# Patient Record
Sex: Male | Born: 1985 | Race: Black or African American | Hispanic: No | Marital: Single | Smoking: Current every day smoker
Health system: Southern US, Community
[De-identification: ages and names within clinical notes are randomized; demographics above are authoritative.]

## PROBLEM LIST (undated history)

## (undated) DIAGNOSIS — K219 Gastro-esophageal reflux disease without esophagitis: Secondary | ICD-10-CM

## (undated) DIAGNOSIS — J45909 Unspecified asthma, uncomplicated: Secondary | ICD-10-CM

## (undated) DIAGNOSIS — S42009A Fracture of unspecified part of unspecified clavicle, initial encounter for closed fracture: Secondary | ICD-10-CM

## (undated) HISTORY — PX: HERNIA REPAIR: SHX51

## (undated) HISTORY — PX: NO PAST SURGERIES: SHX2092

---

## 2008-04-26 ENCOUNTER — Emergency Department: Payer: Self-pay | Admitting: Emergency Medicine

## 2010-07-04 ENCOUNTER — Emergency Department: Payer: Self-pay | Admitting: Internal Medicine

## 2010-08-03 ENCOUNTER — Emergency Department: Payer: Self-pay | Admitting: Internal Medicine

## 2010-12-09 ENCOUNTER — Emergency Department: Payer: Self-pay | Admitting: Emergency Medicine

## 2011-04-28 ENCOUNTER — Emergency Department: Payer: Self-pay | Admitting: Emergency Medicine

## 2011-10-16 ENCOUNTER — Emergency Department: Payer: Self-pay | Admitting: Emergency Medicine

## 2011-10-16 LAB — BASIC METABOLIC PANEL
Anion Gap: 4 — ABNORMAL LOW (ref 7–16)
Calcium, Total: 9.1 mg/dL (ref 8.5–10.1)
Creatinine: 0.82 mg/dL (ref 0.60–1.30)
EGFR (Non-African Amer.): >60
Osmolality: 274 (ref 275–301)
Sodium: 138 mmol/L (ref 136–145)

## 2011-10-16 LAB — CBC
HGB: 14 g/dL (ref 13.0–18.0)
MCH: 32.5 pg (ref 26.0–34.0)
MCV: 97 fL (ref 80–100)
Platelet: 193 10*3/uL (ref 150–440)
RDW: 13 % (ref 11.5–14.5)

## 2011-10-16 LAB — TROPONIN I: Troponin-I: <0.02 ng/mL

## 2012-12-22 ENCOUNTER — Emergency Department: Payer: Self-pay | Admitting: Emergency Medicine

## 2012-12-24 LAB — WOUND AEROBIC CULTURE

## 2013-02-03 ENCOUNTER — Emergency Department: Payer: Self-pay | Admitting: Emergency Medicine

## 2013-02-07 ENCOUNTER — Emergency Department: Payer: Self-pay | Admitting: Emergency Medicine

## 2013-07-30 ENCOUNTER — Emergency Department: Payer: Self-pay | Admitting: Emergency Medicine

## 2014-06-09 DIAGNOSIS — Z8614 Personal history of Methicillin resistant Staphylococcus aureus infection: Secondary | ICD-10-CM

## 2014-06-09 HISTORY — DX: Personal history of Methicillin resistant Staphylococcus aureus infection: Z86.14

## 2015-08-21 ENCOUNTER — Emergency Department
Admission: EM | Admit: 2015-08-21 | Discharge: 2015-08-21 | Disposition: A | Discharge status: Home / Self Care | Payer: Self-pay | Attending: Student | Admitting: Student

## 2015-08-21 ENCOUNTER — Encounter: Payer: Self-pay | Admitting: Emergency Medicine

## 2015-08-21 DIAGNOSIS — F17200 Nicotine dependence, unspecified, uncomplicated: Secondary | ICD-10-CM | POA: Insufficient documentation

## 2015-08-21 DIAGNOSIS — H109 Unspecified conjunctivitis: Secondary | ICD-10-CM | POA: Insufficient documentation

## 2015-08-21 MED ORDER — GENTAMICIN SULFATE 0.3 % OP SOLN: 1.0000 [drp] | OPHTHALMIC | Status: DC

## 2015-08-21 NOTE — ED Notes (Signed)
Developed pain and irritation to both eyes for couple of days

## 2015-08-21 NOTE — ED Notes (Signed)
Pt in via triage w/ complaints of pain/irritation to both eyes since Sunday.  Pt reports, "I woke up Sunday with crust around both eyes."  Pt reports, "I think I may have pink eye, I have had chemical pink eye in the past."  Pt A/Ox4, no immediate distress at this time.

## 2015-08-21 NOTE — Discharge Instructions (Signed)
Bacterial Conjunctivitis °Bacterial conjunctivitis, commonly called pink eye, is an inflammation of the clear membrane that covers the white part of the eye (conjunctiva). The inflammation can also happen on the underside of the eyelids. The blood vessels in the conjunctiva become inflamed, causing the eye to become red or pink. Bacterial conjunctivitis may spread easily from one eye to another and from person to person (contagious).  °CAUSES  °Bacterial conjunctivitis is caused by bacteria. The bacteria may come from your own skin, your upper respiratory tract, or from someone else with bacterial conjunctivitis. °SYMPTOMS  °The normally white color of the eye or the underside of the eyelid is usually pink or red. The pink eye is usually associated with irritation, tearing, and some sensitivity to light. Bacterial conjunctivitis is often associated with a thick, yellowish discharge from the eye. The discharge may turn into a crust on the eyelids overnight, which causes your eyelids to stick together. If a discharge is present, there may also be some blurred vision in the affected eye. °DIAGNOSIS  °Bacterial conjunctivitis is diagnosed by your caregiver through an eye exam and the symptoms that you report. Your caregiver looks for changes in the surface tissues of your eyes, which may point to the specific type of conjunctivitis. A sample of any discharge may be collected on a cotton-tip swab if you have a severe case of conjunctivitis, if your cornea is affected, or if you keep getting repeat infections that do not respond to treatment. The sample will be sent to a lab to see if the inflammation is caused by a bacterial infection and to see if the infection will respond to antibiotic medicines. °TREATMENT  °1. Bacterial conjunctivitis is treated with antibiotics. Antibiotic eyedrops are most often used. However, antibiotic ointments are also available. Antibiotics pills are sometimes used. Artificial tears or eye  washes may ease discomfort. °HOME CARE INSTRUCTIONS  °1. To ease discomfort, apply a cool, clean washcloth to your eye for 10-20 minutes, 3-4 times a day. °2. Gently wipe away any drainage from your eye with a warm, wet washcloth or a cotton ball. °3. Wash your hands often with soap and water. Use paper towels to dry your hands. °4. Do not share towels or washcloths. This may spread the infection. °5. Change or wash your pillowcase every day. °6. You should not use eye makeup until the infection is gone. °7. Do not operate machinery or drive if your vision is blurred. °8. Stop using contact lenses. Ask your caregiver how to sterilize or replace your contacts before using them again. This depends on the type of contact lenses that you use. °9. When applying medicine to the infected eye, do not touch the edge of your eyelid with the eyedrop bottle or ointment tube. °SEEK IMMEDIATE MEDICAL CARE IF:  °· Your infection has not improved within 3 days after beginning treatment. °· You had yellow discharge from your eye and it returns. °· You have increased eye pain. °· Your eye redness is spreading. °· Your vision becomes blurred. °· You have a fever or persistent symptoms for more than 2-3 days. °· You have a fever and your symptoms suddenly get worse. °· You have facial pain, redness, or swelling. °MAKE SURE YOU:  °· Understand these instructions. °· Will watch your condition. °· Will get help right away if you are not doing well or get worse. °  °This information is not intended to replace advice given to you by your health care provider. Make sure you   discuss any questions you have with your health care provider. °  °Document Released: 05/26/2005 Document Revised: 06/16/2014 Document Reviewed: 10/27/2011 °Elsevier Interactive Patient Education ©2016 [employer] ° °How to Use Eye Drops and Eye Ointments °HOW TO APPLY EYE DROPS °Follow these steps when applying eye drops: °2. Wash your hands. °3. Tilt your head  back. °4. Put a finger under your eye and use it to gently pull your lower lid downward. Keep that finger in place. °5. Using your other hand, hold the dropper between your thumb and index finger. °6. Position the dropper just over the edge of the lower lid. Hold it as close to your eye as you can without touching the dropper to your eye. °7. Steady your hand. One way to do this is to lean your index finger against your brow. °8. Look up. °9. Slowly and gently squeeze one drop of medicine into your eye. °10. Close your eye. °11. Place a finger between your lower eyelid and your nose. Press gently for 2 minutes. This increases the amount of time that the medicine is exposed to the eye. It also reduces side effects that can develop if the drop gets into the bloodstream through the nose. °HOW TO APPLY EYE OINTMENTS °Follow these steps when applying eye ointments: °10. Wash your hands. °11. Put a finger under your eye and use it to gently pull your lower lid downward. Keep that finger in place. °12. Using your other hand, place the tip of the tube between your thumb and index finger with the remaining fingers braced against your cheek or nose. °13. Hold the tube just over the edge of your lower lid without touching the tube to your lid or eyeball. °14. Look up. °15. Line the inner part of your lower lid with ointment. °16. Gently pull up on your upper lid and look down. This will force the ointment to spread over the surface of the eye. °17. Release the upper lid. °18. If you can, close your eyes for 1-2 minutes. °Do not rub your eyes. If you applied the ointment correctly, your vision will be blurry for a few minutes. This is normal. °ADDITIONAL INFORMATION °· Make sure to use the eye drops or ointment as told by your health care provider. °· If you have been told to use both eye drops and an eye ointment, apply the eye drops first, then wait 3-4 minutes before you apply the ointment. °· Try not to touch the tip of the  dropper or tube to your eye. A dropper or tube that has touched the eye can become contaminated. °  °This information is not intended to replace advice given to you by your health care provider. Make sure you discuss any questions you have with your health care provider. °  °Document Released: 09/01/2000 Document Revised: 10/10/2014 Document Reviewed: 05/22/2014 °Elsevier Interactive Patient Education ©2016 [employer] ° °

## 2015-08-21 NOTE — ED Provider Notes (Signed)
[facility] Emergency Department Provider Note  ____________________________________________  Time seen: Approximately 7:27 PM  I have reviewed the triage vital signs and the nursing notes.   HISTORY  Chief Complaint Eye Drainage    HPI Hyman BowerDamien A Chuba is a 30 y.o. male patient complaining of bilateral greenish discharge from his eyes. Patient stateeyelids are matted shut upon awakening. Onset 2 days ago. He denies any vision disturbance. Patient denies any upper respiratory infection. States do not wear contact lenses. Patient rates his pain discomfort as a 4/10. No palliative measures taken for this complaint.   No past medical history on file.  There are no active problems to display for this patient.   History reviewed. No pertinent past surgical history.  Current Outpatient Rx  Name  Route  Sig  Dispense  Refill  . gentamicin (GARAMYCIN) 0.3 % ophthalmic solution   Both Eyes   Place 1 drop into both eyes every 4 (four) hours.   5 mL   0     Allergies Review of patient's allergies indicates no known allergies.  No family history on file.  Social History Social History  Substance Use Topics  . Smoking status: Current Every Day Smoker  . Smokeless tobacco: None  . Alcohol Use: Yes    Review of Systems Constitutional: No fever/chills Eyes: No visual changes. Bilateral eye discharge. Matted eyelids. ENT: No sore throat. Cardiovascular: Denies chest pain. Respiratory: Denies shortness of breath. Gastrointestinal: No abdominal pain.  No nausea, no vomiting.  No diarrhea.  No constipation. Genitourinary: Negative for dysuria. Musculoskeletal: Negative for back pain. Skin: Negative for rash. Neurological: Negative for headaches, focal weakness or numbness.    ____________________________________________   PHYSICAL EXAM:  VITAL SIGNS: ED Triage Vitals  Enc Vitals Group     BP 08/21/15 1923 140/90 mmHg     Pulse Rate 08/21/15  1923 70     Resp 08/21/15 1923 14     Temp 08/21/15 1923 98.5 F (36.9 C)     Temp Source 08/21/15 1923 Oral     SpO2 08/21/15 1923 100 %     Weight 08/21/15 1923 135 lb (61.236 kg)     Height 08/21/15 1923 5\' 11"  (1.803 m)     Head Cir --      Peak Flow --      Pain Score 08/21/15 1844 6     Pain Loc --      Pain Edu? --      Excl. in GC? --     Constitutional: Alert and oriented. Well appearing and in no acute distress. Eyes: Conjunctivae are erythematous. PERRL. EOMI. chronic greenish secretion bilaterally Head: Atraumatic. Nose: No congestion/rhinnorhea. Mouth/Throat: Mucous membranes are moist.  Oropharynx non-erythematous. Neck: No stridor.  No cervical spine tenderness to palpation. Hematological/Lymphatic/Immunilogical: No cervical lymphadenopathy. Cardiovascular: Normal rate, regular rhythm. Grossly normal heart sounds.  Good peripheral circulation. Respiratory: Normal respiratory effort.  No retractions. Lungs CTAB. Gastrointestinal: Soft and nontender. No distention. No abdominal bruits. No CVA tenderness. Musculoskeletal: No lower extremity tenderness nor edema.  No joint effusions. Neurologic:  Normal speech and language. No gross focal neurologic deficits are appreciated. No gait instability. Skin:  Skin is warm, dry and intact. No rash noted. Psychiatric: Mood and affect are normal. Speech and behavior are normal.  ____________________________________________   LABS (all labs ordered are listed, but only abnormal results are displayed)  Labs Reviewed - No data to display ____________________________________________  EKG   ____________________________________________  RADIOLOGY  ____________________________________________   PROCEDURES  Procedure(s) performed: None  Critical Care performed: No  ____________________________________________   INITIAL IMPRESSION / ASSESSMENT AND PLAN / ED COURSE  Pertinent labs & imaging results that were  available during my care of the patient were reviewed by me and considered in my medical decision making (see chart for details).  Bilateral conjunctivitis. Patient given discharge Instructions. Patient given prescription for Garamycin. Patient given a work note for one day. Patient [facility]. ____________________________________________   FINAL CLINICAL IMPRESSION(S) / ED DIAGNOSES  Final diagnoses:  Bilateral conjunctivitis      Joni Reining, PA-C 08/21/15 1935  Joni Reining, PA-C 08/21/15 1939  Gayla Doss, MD 08/21/15 [id number]

## 2016-08-16 ENCOUNTER — Encounter: Payer: Self-pay | Admitting: Emergency Medicine

## 2016-08-16 ENCOUNTER — Emergency Department
Admission: EM | Admit: 2016-08-16 | Discharge: 2016-08-16 | Disposition: A | Discharge status: Home / Self Care | Payer: Managed Care, Other (non HMO) | Attending: Emergency Medicine | Admitting: Emergency Medicine

## 2016-08-16 DIAGNOSIS — F17200 Nicotine dependence, unspecified, uncomplicated: Secondary | ICD-10-CM | POA: Diagnosis not present

## 2016-08-16 DIAGNOSIS — J40 Bronchitis, not specified as acute or chronic: Secondary | ICD-10-CM | POA: Insufficient documentation

## 2016-08-16 DIAGNOSIS — R05 Cough: Secondary | ICD-10-CM | POA: Diagnosis present

## 2016-08-16 DIAGNOSIS — B349 Viral infection, unspecified: Secondary | ICD-10-CM

## 2016-08-16 MED ORDER — PREDNISONE 50 MG PO TABS: 50.0000 mg | ORAL_TABLET | Freq: Every day | ORAL | 0 refills | Status: DC

## 2016-08-16 MED ORDER — ALBUTEROL SULFATE HFA 108 (90 BASE) MCG/ACT IN AERS: 2.0000 | INHALATION_SPRAY | RESPIRATORY_TRACT | 0 refills | Status: DC | PRN

## 2016-08-16 MED ORDER — PSEUDOEPH-BROMPHEN-DM 30-2-10 MG/5ML PO SYRP: 10.0000 mL | ORAL_SOLUTION | Freq: Four times a day (QID) | ORAL | 0 refills | Status: DC | PRN

## 2016-08-16 NOTE — ED Triage Notes (Signed)
Patient to ER for c/o generalized body aches, cough, sore throat, fever (unknown temp), and pain to chest when coughing. Patient also c/o nasal congestion and drainage.

## 2016-08-16 NOTE — ED Provider Notes (Signed)
[facility] Emergency Department Provider Note  ____________________________________________  Time seen: Approximately 5:50 PM  I have reviewed the triage vital signs and the nursing notes.   HISTORY  Chief Complaint URI    HPI Antonio Blackburn is a 31 y.o. male who presents emergency department complaining of 2-3 day history of nasal congestion, scratchy throat, cough. Patient reports that when he coughs he has a sharp burning sensation to his ribs. He denies any definitive chest pain. Patient is taken some TheraFlu with improvement but no other medications. He denies any headache, visual changes, chest pain, shortness of breath, developing, nausea or vomiting. No known sick contacts. Patient reports subjective, tactile fever with chills. He reports over the last 24 hours he has not had a tactile fever. No other complaints at this time.   History reviewed. No pertinent past medical history.  There are no active problems to display for this patient.   History reviewed. No pertinent surgical history.  Prior to Admission medications   Medication Sig Start Date End Date Taking? Authorizing Provider  albuterol (PROVENTIL HFA;VENTOLIN HFA) 108 (90 Base) MCG/ACT inhaler Inhale 2 puffs into the lungs every 4 (four) hours as needed for wheezing or shortness of breath. 08/16/16   Delorise Royals Cuthriell, PA-C  brompheniramine-pseudoephedrine-DM 30-2-10 MG/5ML syrup Take 10 mLs by mouth 4 (four) times daily as needed. 08/16/16   Delorise Royals Cuthriell, PA-C  gentamicin (GARAMYCIN) 0.3 % ophthalmic solution Place 1 drop into both eyes every 4 (four) hours. 08/21/15   Joni Reining, PA-C  predniSONE (DELTASONE) 50 MG tablet Take 1 tablet (50 mg total) by mouth daily with breakfast. 08/16/16   Delorise Royals Cuthriell, PA-C    Allergies Patient has no known allergies.  No family history on file.  Social History Social History  Substance Use Topics  . Smoking status: Current  Every Day Smoker  . Smokeless tobacco: Never Used  . Alcohol use Yes     Review of Systems  Constitutional: No fever/chills Eyes: No visual changes. No discharge ENT: Positive for nasal congestion and scratchy throat. Cardiovascular: no chest pain. Respiratory: Positive cough. No SOB. Gastrointestinal: No abdominal pain.  No nausea, no vomiting.  No diarrhea.  No constipation. Musculoskeletal: Negative for musculoskeletal pain. Skin: Negative for rash, abrasions, lacerations, ecchymosis. Neurological: Negative for headaches, focal weakness or numbness. 10-point ROS otherwise negative.  ____________________________________________   PHYSICAL EXAM:  VITAL SIGNS: ED Triage Vitals [08/16/16 1712]  Enc Vitals Group     BP (!) 137/91     Pulse Rate 85     Resp 18     Temp 99 F (37.2 C)     Temp Source Oral     SpO2 97 %     Weight 145 lb (65.8 kg)     Height 5\' 11"  (1.803 m)     Head Circumference      Peak Flow      Pain Score 4     Pain Loc      Pain Edu?      Excl. in GC?      Constitutional: Alert and oriented. Well appearing and in no acute distress. Eyes: Conjunctivae are normal. PERRL. EOMI. Head: Atraumatic. ENT:      Ears:       Nose: No congestion/rhinnorhea.      Mouth/Throat: Mucous membranes are moist.  Neck: No stridor.   Hematological/Lymphatic/Immunilogical: Diffuse, mobile, nontender anterior cervical lymphadenopathy. Cardiovascular: Normal rate, regular rhythm. Normal S1 and S2.  Good peripheral circulation. Respiratory: Normal respiratory effort without tachypnea or retractions. Lungs with scattered respiratory wheezes. No rales or rhonchi.Peri [place]. Good air entry to the bases with no decreased or absent breath sounds. Musculoskeletal: Full range of motion to all extremities. No gross deformities appreciated. Neurologic:  Normal speech and language. No gross focal neurologic deficits are appreciated.  Skin:  Skin is warm, dry and intact. No rash  noted. Psychiatric: Mood and affect are normal. Speech and behavior are normal. Patient exhibits appropriate insight and judgement.   ____________________________________________   LABS (all labs ordered are listed, but only abnormal results are displayed)  Labs Reviewed - No data to display ____________________________________________  EKG   ____________________________________________  RADIOLOGY   No results found.  ____________________________________________    PROCEDURES  Procedure(s) performed:    Procedures    Medications - No data to display   ____________________________________________   INITIAL IMPRESSION / ASSESSMENT AND PLAN / ED COURSE  Pertinent labs & imaging results that were available during my care of the patient were reviewed by me and considered in my medical decision making (see chart for details).  Review of the [place] CSRS was performed in accordance of the [facility] prior to dispensing any controlled drugs.     Patient's diagnosis is consistent with probable respiratory infection with bronchitis. No indication for labs or imaging at this time.. Patient will be discharged home with prescriptions for prednisone, albuterol, cough medication. Patient is to follow up with primary care as needed or otherwise directed. Patient is given ED precautions to return to the ED for any worsening or new symptoms.     ____________________________________________  FINAL CLINICAL IMPRESSION(S) / ED DIAGNOSES  Final diagnoses:  Bronchitis  Viral syndrome      NEW MEDICATIONS STARTED DURING THIS VISIT:  New Prescriptions   ALBUTEROL (PROVENTIL HFA;VENTOLIN HFA) 108 (90 BASE) MCG/ACT INHALER    Inhale 2 puffs into the lungs every 4 (four) hours as needed for wheezing or shortness of breath.   BROMPHENIRAMINE-PSEUDOEPHEDRINE-DM 30-2-10 MG/5ML SYRUP    Take 10 mLs by mouth 4 (four) times daily as needed.   PREDNISONE (DELTASONE) 50 MG TABLET    Take 1  tablet (50 mg total) by mouth daily with breakfast.        This chart was dictated using voice recognition software/Dragon. Despite best efforts to proofread, errors can occur which can change the meaning. Any change was purely unintentional.    Racheal PatchesJonathan D Cuthriell, PA-C 08/16/16 1844    Governor Rooksebecca Lord, MD 08/17/16 1104

## 2016-08-16 NOTE — ED Notes (Signed)
Pt c/o URI since last week. Pt c/o pain with cough and blowing his nose, c/o pressure in his head. Pt is alert and oriented at this time. NAD noted, respirations even and unlabored at this time.

## 2016-09-22 ENCOUNTER — Emergency Department: Payer: Managed Care, Other (non HMO)

## 2016-09-22 ENCOUNTER — Encounter: Payer: Self-pay | Admitting: Emergency Medicine

## 2016-09-22 DIAGNOSIS — F17200 Nicotine dependence, unspecified, uncomplicated: Secondary | ICD-10-CM | POA: Insufficient documentation

## 2016-09-22 DIAGNOSIS — S46911A Strain of unspecified muscle, fascia and tendon at shoulder and upper arm level, right arm, initial encounter: Secondary | ICD-10-CM | POA: Diagnosis not present

## 2016-09-22 DIAGNOSIS — S4991XA Unspecified injury of right shoulder and upper arm, initial encounter: Secondary | ICD-10-CM | POA: Diagnosis present

## 2016-09-22 DIAGNOSIS — Y990 Civilian activity done for income or pay: Secondary | ICD-10-CM | POA: Diagnosis not present

## 2016-09-22 DIAGNOSIS — X58XXXA Exposure to other specified factors, initial encounter: Secondary | ICD-10-CM | POA: Diagnosis not present

## 2016-09-22 DIAGNOSIS — Z79899 Other long term (current) drug therapy: Secondary | ICD-10-CM | POA: Diagnosis not present

## 2016-09-22 DIAGNOSIS — Y929 Unspecified place or not applicable: Secondary | ICD-10-CM | POA: Insufficient documentation

## 2016-09-22 DIAGNOSIS — Y9389 Activity, other specified: Secondary | ICD-10-CM | POA: Diagnosis not present

## 2016-09-22 NOTE — ED Triage Notes (Signed)
Patient ambulatory to triage with steady gait, without difficulty or distress noted; pt reports cleaning garage last Wed and since has been having pain & stiffness to right shoulder that increases with any movement

## 2016-09-23 ENCOUNTER — Emergency Department
Admission: EM | Admit: 2016-09-23 | Discharge: 2016-09-23 | Disposition: A | Discharge status: Home / Self Care | Payer: Managed Care, Other (non HMO) | Attending: Emergency Medicine | Admitting: Emergency Medicine

## 2016-09-23 DIAGNOSIS — S46911A Strain of unspecified muscle, fascia and tendon at shoulder and upper arm level, right arm, initial encounter: Secondary | ICD-10-CM

## 2016-09-23 HISTORY — DX: Fracture of unspecified part of unspecified clavicle, initial encounter for closed fracture: S42.009A

## 2016-09-23 NOTE — ED Provider Notes (Signed)
[facility] Emergency Department Provider Note   ____________________________________________   First MD Initiated Contact with Patient 09/23/16 0209     (approximate)  I have reviewed the triage vital signs and the nursing notes.   HISTORY  Chief Complaint Shoulder Injury    HPI Antonio Blackburn is a 31 y.o. male who presents to the ED from home with a chief complaint of right shoulder pain. Patient reports he was cleaning the garage 6 days ago when he pulled a box from a shelf and heard a "click" in his shoulder. He is right-hand dominant. He had been experiencing pain and stiffness to the right shoulder which increased with movements. Subsequently his shoulder began to feel better and he presents to the ED to request a note to clear him to return to work.Denies fever, chills, extremity weakness, numbness/tingling, chest pain, shortness of breath, abdominal pain, nausea, vomiting. Denies recent travel or trauma.   Past Medical History:  Diagnosis Date  . Clavicle fracture    right    There are no active problems to display for this patient.   History reviewed. No pertinent surgical history.  Prior to Admission medications   Medication Sig Start Date End Date Taking? Authorizing Provider  albuterol (PROVENTIL HFA;VENTOLIN HFA) 108 (90 Base) MCG/ACT inhaler Inhale 2 puffs into the lungs every 4 (four) hours as needed for wheezing or shortness of breath. 08/16/16   Delorise Royals Cuthriell, PA-C  brompheniramine-pseudoephedrine-DM 30-2-10 MG/5ML syrup Take 10 mLs by mouth 4 (four) times daily as needed. 08/16/16   Delorise Royals Cuthriell, PA-C  gentamicin (GARAMYCIN) 0.3 % ophthalmic solution Place 1 drop into both eyes every 4 (four) hours. 08/21/15   Joni Reining, PA-C  predniSONE (DELTASONE) 50 MG tablet Take 1 tablet (50 mg total) by mouth daily with breakfast. 08/16/16   Delorise Royals Cuthriell, PA-C    Allergies Patient has no known allergies.  No family  history on file.  Social History Social History  Substance Use Topics  . Smoking status: Current Every Day Smoker  . Smokeless tobacco: Never Used  . Alcohol use Yes    Review of Systems  Constitutional: No fever/chills. Eyes: No visual changes. ENT: No sore throat. Cardiovascular: Denies chest pain. Respiratory: Denies shortness of breath. Gastrointestinal: No abdominal pain.  No nausea, no vomiting.  No diarrhea.  No constipation. Genitourinary: Negative for dysuria. Musculoskeletal: Positive for right shoulder pain. Negative for back pain. Skin: Negative for rash. Neurological: Negative for headaches, focal weakness or numbness.  10-point ROS otherwise negative.  ____________________________________________   PHYSICAL EXAM:  VITAL SIGNS: ED Triage Vitals [09/22/16 2246]  Enc Vitals Group     BP (!) 132/91     Pulse Rate 61     Resp 18     Temp 98 F (36.7 C)     Temp Source Oral     SpO2 100 %     Weight 145 lb (65.8 kg)     Height  (1.803 m)     Head Circumference      Peak Flow      Pain Score 7     Pain Loc      Pain Edu?      Excl. in GC?     Constitutional: Alert and oriented. Well appearing and in no acute distress. Eyes: Conjunctivae are normal. PERRL. EOMI. Head: Atraumatic. Nose: No congestion/rhinnorhea. Mouth/Throat: Mucous membranes are moist.  Oropharynx non-erythematous. Neck: No stridor.   Cardiovascular: Normal rate,  regular rhythm. Grossly normal heart sounds.  Good peripheral circulation. Respiratory: Normal respiratory effort.  No retractions. Lungs CTAB. Gastrointestinal: Soft and nontender. No distention. No abdominal bruits. No CVA tenderness. Musculoskeletal: Right anterior shoulder minimally tender to palpation. Full range of motion without pain. 2+ radial pulses. Brisk less than 5 second capillary refill. Neurologic:  Normal speech and language. No gross focal neurologic deficits are appreciated. No gait instability. Skin:   Skin is warm, dry and intact. No rash noted. Psychiatric: Mood and affect are normal. Speech and behavior are normal.  ____________________________________________   LABS (all labs ordered are listed, but only abnormal results are displayed)  Labs Reviewed - No data to display ____________________________________________  EKG  None ____________________________________________  RADIOLOGY  Right shoulder x-ray interpreted per Dr. Andria Meuse: Negative ____________________________________________   PROCEDURES  Procedure(s) performed: None  Procedures  Critical Care performed: No  ____________________________________________   INITIAL IMPRESSION / ASSESSMENT AND PLAN / ED COURSE  Pertinent labs & imaging results that were available during my care of the patient were reviewed by me and considered in my medical decision making (see chart for details).  31 year old male who presents to the ED requesting a return to work note status post right shoulder strain which is nearly resolved. Strict return precautions given. Patient verbalizes understanding and agrees with plan of care.      ____________________________________________   FINAL CLINICAL IMPRESSION(S) / ED DIAGNOSES  Final diagnoses:  Right shoulder strain, initial encounter      NEW MEDICATIONS STARTED DURING THIS VISIT:  New Prescriptions   No medications on file     Note:  This document was prepared using Dragon voice recognition software and may include unintentional dictation errors.    Irean Hong, MD 09/23/16 [phone]

## 2016-09-23 NOTE — Discharge Instructions (Signed)
You may go back to work. Return to the ER for worsened symptoms, persistent vomiting, difficult breathing or other concerns.

## 2017-02-12 ENCOUNTER — Emergency Department
Admission: EM | Admit: 2017-02-12 | Discharge: 2017-02-12 | Disposition: A | Discharge status: Home / Self Care | Payer: No Typology Code available for payment source | Attending: Emergency Medicine | Admitting: Emergency Medicine

## 2017-02-12 DIAGNOSIS — S161XXA Strain of muscle, fascia and tendon at neck level, initial encounter: Secondary | ICD-10-CM | POA: Insufficient documentation

## 2017-02-12 DIAGNOSIS — Y92410 Unspecified street and highway as the place of occurrence of the external cause: Secondary | ICD-10-CM | POA: Insufficient documentation

## 2017-02-12 DIAGNOSIS — Y9389 Activity, other specified: Secondary | ICD-10-CM | POA: Diagnosis not present

## 2017-02-12 DIAGNOSIS — S199XXA Unspecified injury of neck, initial encounter: Secondary | ICD-10-CM | POA: Diagnosis present

## 2017-02-12 DIAGNOSIS — F17210 Nicotine dependence, cigarettes, uncomplicated: Secondary | ICD-10-CM | POA: Insufficient documentation

## 2017-02-12 DIAGNOSIS — Z79899 Other long term (current) drug therapy: Secondary | ICD-10-CM | POA: Diagnosis not present

## 2017-02-12 DIAGNOSIS — Y999 Unspecified external cause status: Secondary | ICD-10-CM | POA: Insufficient documentation

## 2017-02-12 DIAGNOSIS — M542 Cervicalgia: Secondary | ICD-10-CM

## 2017-02-12 MED ORDER — CYCLOBENZAPRINE HCL 5 MG PO TABS: 5.0000 mg | ORAL_TABLET | Freq: Three times a day (TID) | ORAL | 0 refills | Status: DC | PRN

## 2017-02-12 MED ORDER — KETOROLAC TROMETHAMINE 30 MG/ML IJ SOLN
30.0000 mg | Freq: Once | INTRAMUSCULAR | Status: AC
  Administered 2017-02-12: 30 mg via INTRAMUSCULAR
  Filled 2017-02-12: qty 1

## 2017-02-12 MED ORDER — IBUPROFEN 800 MG PO TABS: 800.0000 mg | ORAL_TABLET | Freq: Three times a day (TID) | ORAL | 0 refills | Status: DC | PRN

## 2017-02-12 NOTE — ED Notes (Signed)
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
## 2017-02-12 NOTE — Discharge Instructions (Signed)
Please take medications as prescribed. Take ibuprofen with food for 7-10 days. Follow-up with orthopedics if no improvement in 10-14 days.
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
## 2018-09-25 LAB — HM HIV SCREENING LAB: HM HIV Screening: NEGATIVE

## 2018-09-25 LAB — HM HEPATITIS C SCREENING LAB: HM Hepatitis Screen: NEGATIVE

## 2019-04-10 ENCOUNTER — Emergency Department: Payer: 59

## 2019-04-10 ENCOUNTER — Encounter: Payer: Self-pay | Admitting: Emergency Medicine

## 2019-04-10 ENCOUNTER — Emergency Department
Admission: EM | Admit: 2019-04-10 | Discharge: 2019-04-10 | Disposition: A | Discharge status: Home / Self Care | Payer: 59 | Attending: Emergency Medicine | Admitting: Emergency Medicine

## 2019-04-10 DIAGNOSIS — Y929 Unspecified place or not applicable: Secondary | ICD-10-CM | POA: Insufficient documentation

## 2019-04-10 DIAGNOSIS — W228XXA Striking against or struck by other objects, initial encounter: Secondary | ICD-10-CM | POA: Insufficient documentation

## 2019-04-10 DIAGNOSIS — S022XXA Fracture of nasal bones, initial encounter for closed fracture: Secondary | ICD-10-CM

## 2019-04-10 DIAGNOSIS — S0990XA Unspecified injury of head, initial encounter: Secondary | ICD-10-CM | POA: Diagnosis present

## 2019-04-10 DIAGNOSIS — Y9389 Activity, other specified: Secondary | ICD-10-CM | POA: Diagnosis not present

## 2019-04-10 DIAGNOSIS — F17200 Nicotine dependence, unspecified, uncomplicated: Secondary | ICD-10-CM | POA: Insufficient documentation

## 2019-04-10 DIAGNOSIS — Y998 Other external cause status: Secondary | ICD-10-CM | POA: Insufficient documentation

## 2019-04-10 DIAGNOSIS — S0181XA Laceration without foreign body of other part of head, initial encounter: Secondary | ICD-10-CM | POA: Insufficient documentation
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
## 2019-04-19 ENCOUNTER — Ambulatory Visit: Admit: 2019-04-19 | Payer: 59 | Admitting: Otolaryngology

## 2019-04-19 SURGERY — CLOSED REDUCTION, FRACTURE, NASAL BONE
Anesthesia: General

## 2020-01-16 ENCOUNTER — Ambulatory Visit: Payer: Self-pay | Attending: Critical Care Medicine

## 2020-01-16 DIAGNOSIS — Z23 Encounter for immunization: Secondary | ICD-10-CM

## 2020-01-16 NOTE — Progress Notes (Signed)
   Covid-19 Vaccination Clinic  Name:  Antonio Blackburn    MRN: [id number] DOB: [birth year]  01/16/2020  [name] was observed post Covid-19 immunization for 15 minutes without incident. He was provided with Vaccine Information Sheet and instruction to access the V-Safe system.   [name] was instructed to call 911 with any severe reactions post vaccine: Marland Kitchen Difficulty breathing  . Swelling of face and throat  . A fast heartbeat  . A bad rash all over body  . Dizziness and weakness   Immunizations Administered    Name Date Dose VIS Date Route   Pfizer COVID-19 Vaccine 01/16/2020  5:19 PM 0.3 mL 08/03/2018 Intramuscular   Manufacturer: [employer], [employer]   Lot: [id number]   NDC: [address]-8

## 2020-02-06 ENCOUNTER — Ambulatory Visit: Payer: Self-pay | Attending: Internal Medicine

## 2020-02-06 DIAGNOSIS — Z23 Encounter for immunization: Secondary | ICD-10-CM

## 2020-02-06 NOTE — Progress Notes (Signed)
   Covid-19 Vaccination Clinic  Name:  Antonio Blackburn    MRN: [id number] DOB: 1985/09/12  02/06/2020  [name] was observed post Covid-19 immunization for 15 minutes without incident. He was provided with Vaccine Information Sheet and instruction to access the V-Safe system.   [name] was instructed to call 911 with any severe reactions post vaccine: Marland Kitchen Difficulty breathing  . Swelling of face and throat  . A fast heartbeat  . A bad rash all over body  . Dizziness and weakness   Immunizations Administered    Name Date Dose VIS Date Route   Pfizer COVID-19 Vaccine 02/06/2020  4:22 PM 0.3 mL 08/03/2018 Intramuscular   Manufacturer: [employer], [employer]   Lot: [id number]   NDC: [address]-4

## 2020-02-15 ENCOUNTER — Other Ambulatory Visit: Payer: Self-pay

## 2020-02-15 ENCOUNTER — Ambulatory Visit: Payer: Self-pay

## 2020-02-15 ENCOUNTER — Encounter: Payer: Self-pay | Admitting: Family Medicine

## 2020-02-15 ENCOUNTER — Ambulatory Visit: Payer: Self-pay | Admitting: Family Medicine

## 2020-02-15 DIAGNOSIS — Z202 Contact with and (suspected) exposure to infections with a predominantly sexual mode of transmission: Secondary | ICD-10-CM

## 2020-02-15 DIAGNOSIS — N341 Nonspecific urethritis: Secondary | ICD-10-CM

## 2020-02-15 DIAGNOSIS — Z113 Encounter for screening for infections with a predominantly sexual mode of transmission: Secondary | ICD-10-CM

## 2020-02-15 LAB — GRAM STAIN

## 2020-02-15 MED ORDER — AZITHROMYCIN 500 MG PO TABS
1000.0000 mg | ORAL_TABLET | Freq: Once | ORAL | Status: AC
  Administered 2020-02-15: 1000 mg via ORAL

## 2020-02-15 MED ORDER — METRONIDAZOLE 500 MG PO TABS: 2000.0000 mg | ORAL_TABLET | Freq: Once | ORAL | 0 refills | Status: AC

## 2020-02-15 MED ORDER — METRONIDAZOLE 500 MG PO TABS: 2000.0000 mg | ORAL_TABLET | Freq: Once | ORAL | Status: DC

## 2020-02-15 NOTE — Progress Notes (Signed)
° °  [facility] Department STI clinic/screening visit  Subjective:  Antonio Blackburn is a 34 y.o. male being seen today for an STI screening visit. The patient reports they do not have symptoms.    Patient has the following medical conditions:  There are no problems to display for this patient.    Chief Complaint  Patient presents with   Exposure to STD    HPI  Patient reports that his partner has trich and he needs treatment. Denies symptoms   See flowsheet for further details and programmatic requirements.    The following portions of the patient's history were reviewed and updated as appropriate: allergies, current medications, past medical history, past social history, past surgical history and problem list.  Objective:  There were no vitals filed for this visit.  Physical Exam Constitutional:      Appearance: Normal appearance.  HENT:     Head: Normocephalic and atraumatic.     Comments: No nits or hair loss    Mouth/Throat:     Mouth: Mucous membranes are moist.     Pharynx: Oropharynx is clear. No oropharyngeal exudate or posterior oropharyngeal erythema.  Pulmonary:     Effort: Pulmonary effort is normal.  Abdominal:     General: Abdomen is flat.     Palpations: Abdomen is soft. There is no hepatomegaly or mass.     Tenderness: There is no abdominal tenderness.  Genitourinary:    Pubic Area: No rash or pubic lice.      Penis: Normal.      Testes: Normal.     Epididymis:     Right: Normal.     Left: Normal.     Comments: Small amount clear discharge noted Lymphadenopathy:     Head:     Right side of head: No preauricular or posterior auricular adenopathy.     Left side of head: No preauricular or posterior auricular adenopathy.     Cervical: No cervical adenopathy.     Upper Body:     Right upper body: No supraclavicular or axillary adenopathy.     Left upper body: No supraclavicular or axillary adenopathy.     Lower Body: No right inguinal  adenopathy. No left inguinal adenopathy.  Skin:    General: Skin is warm and dry.     Findings: No rash.  Neurological:     Mental Status: He is alert and oriented to person, place, and time.       Assessment and Plan:  [name] is a 34 y.o. male presenting to the [facility] Department for STI screening  1. Screening examination for venereal disease  - Gonococcus culture - Gram stain - HIV [facility] - Syphilis Serology, [facility] - Gonococcus culture  2. Venereal disease contact Contact to trich - metroNIDAZOLE (FLAGYL) tablet 2,000 mg Counseled no sexual activity x 1 week. Condoms always   No follow-ups on file.  No future appointments.  Larene Pickett, FNP

## 2020-02-15 NOTE — Progress Notes (Addendum)
Patient posted by me and treated for NGU with Azithromycin 1g po DOT and with Metronidazole 2 po as contact to Trich. Provider put in Metronidazole 2 g as clinic administered instead of as a sample med.  Patient was dispensed the medication as a sample med with Lot #[id number] and expiration date of 11/06/2020.

## 2020-02-20 LAB — GONOCOCCUS CULTURE

## 2020-06-27 ENCOUNTER — Ambulatory Visit: Payer: Self-pay

## 2020-07-27 ENCOUNTER — Ambulatory Visit: Payer: Self-pay

## 2020-07-30 ENCOUNTER — Ambulatory Visit: Payer: Self-pay

## 2020-08-01 ENCOUNTER — Ambulatory Visit: Payer: 59 | Admitting: Physician Assistant

## 2020-08-01 ENCOUNTER — Other Ambulatory Visit: Payer: Self-pay

## 2020-08-01 DIAGNOSIS — Z299 Encounter for prophylactic measures, unspecified: Secondary | ICD-10-CM

## 2020-08-01 DIAGNOSIS — Z113 Encounter for screening for infections with a predominantly sexual mode of transmission: Secondary | ICD-10-CM

## 2020-08-01 MED ORDER — DOXYCYCLINE HYCLATE 100 MG PO TABS: 100.0000 mg | ORAL_TABLET | Freq: Two times a day (BID) | ORAL | 0 refills | Status: AC

## 2020-08-01 NOTE — Progress Notes (Signed)
Patient treated with Doxycycline per providers orders. Gram stain reviewed with provider.Burt Knack, RN

## 2020-08-01 NOTE — Progress Notes (Signed)
Here for STD testing..Damiel Barthold Brewer-Jensen, RN  

## 2020-08-02 LAB — GRAM STAIN

## 2020-08-03 ENCOUNTER — Encounter: Payer: Self-pay | Admitting: Physician Assistant

## 2020-08-03 NOTE — Progress Notes (Signed)
[facility] Department STI clinic/screening visit  Subjective:  Antonio Blackburn is a 35 y.o. male being seen today for an STI screening visit. The patient reports they do have symptoms.    Patient has the following medical conditions:  There are no problems to display for this patient.    Chief Complaint  Patient presents with  . SEXUALLY TRANSMITTED DISEASE    screening    HPI  Patient reports that he has had swelling in the groin area for 4-5 weeks off and on.  Denies other symptoms except slight dysuria off and on as well.  States that last HIV test was in 01/2020 and last void prior to sample collection for Gram stain was over 2 hr ago.   See flowsheet for further details and programmatic requirements.    The following portions of the patient's history were reviewed and updated as appropriate: allergies, current medications, past medical history, past social history, past surgical history and problem list.  Objective:  There were no vitals filed for this visit.  Physical Exam Constitutional:      General: He is not in acute distress.    Appearance: Normal appearance.  HENT:     Head: Normocephalic and atraumatic.     Comments: No nits,lice, or hair loss. No cervical, supraclavicular or axillary adenopathy.    Mouth/Throat:     Mouth: Mucous membranes are moist.     Pharynx: Oropharynx is clear. No oropharyngeal exudate or posterior oropharyngeal erythema.  Eyes:     Conjunctiva/sclera: Conjunctivae normal.  Pulmonary:     Effort: Pulmonary effort is normal.  Abdominal:     Palpations: Abdomen is soft. There is no mass.     Tenderness: There is no abdominal tenderness. There is no guarding or rebound.  Genitourinary:    Penis: Normal.      Testes: Normal.     Comments: Pubic area without nits, lice, hair loss, edema, erythema, lesions and inguinal adenopathy. Penis circumcised without rash, lesions and discharge at meatus. Musculoskeletal:      Cervical back: Neck supple. No tenderness.  Skin:    General: Skin is warm and dry.     Findings: No bruising, erythema, lesion or rash.  Neurological:     Mental Status: He is alert and oriented to person, place, and time.  Psychiatric:        Mood and Affect: Mood normal.        Behavior: Behavior normal.        Thought Content: Thought content normal.        Judgment: Judgment normal.       Assessment and Plan:  Antonio Blackburn is a 35 y.o. male presenting to the [facility] Department for STI screening  1. Screening for STD (sexually transmitted disease) Patient into clinic with symptoms. Rec condoms with all sex. Await test results.  Counseled that RN will call if needs to RTC for treatment once results are back. - Gram stain - Gonococcus culture - HIV [facility] - Syphilis Serology,  Lab  2. Prophylactic measure Will treat patient due to symptoms and risk with Doxycycline 100 mg #14 1 po BID for 7 days. No sex for 14 days and until after partner completes screening and treatment. Call with questions or concerns. - doxycycline (VIBRA-TABS) 100 MG tablet; Take 1 tablet (100 mg total) by mouth 2 (two) times daily for 7 days.  Dispense: 14 tablet; Refill: 0  No follow-ups on file.  No future appointments.  Jerene Dilling, PA

## 2020-08-06 LAB — GONOCOCCUS CULTURE

## 2020-09-07 ENCOUNTER — Ambulatory Visit: Payer: BLUE CROSS/BLUE SHIELD

## 2020-09-13 ENCOUNTER — Encounter: Payer: Self-pay | Admitting: Physician Assistant

## 2020-09-13 ENCOUNTER — Other Ambulatory Visit: Payer: Self-pay

## 2020-09-13 ENCOUNTER — Ambulatory Visit: Payer: Self-pay | Admitting: Physician Assistant

## 2020-09-13 DIAGNOSIS — Z113 Encounter for screening for infections with a predominantly sexual mode of transmission: Secondary | ICD-10-CM

## 2020-09-13 LAB — GRAM STAIN

## 2020-09-13 NOTE — Progress Notes (Signed)
[facility] Department STI clinic/screening visit  Subjective:  Antonio Blackburn is a 35 y.o. male being seen today for an STI screening visit. The patient reports they do have symptoms.    Patient has the following medical conditions:  There are no problems to display for this patient.    Chief Complaint  Patient presents with  . SEXUALLY TRANSMITTED DISEASE    screening    HPI  Patient reports that he has had some pain in his groin/pubic area on the left side off and on for about 2 months.  Reports that sometimes the area swells and is tender in the pubic area just above the base of his penis.  Denies other symptoms, chronic conditions, surgeries and regular medicines.  States last HIV test was 07/2020 and last void prior to sample collection for Gram stain was over 2 hr ago.   See flowsheet for further details and programmatic requirements.    The following portions of the patient's history were reviewed and updated as appropriate: allergies, current medications, past medical history, past social history, past surgical history and problem list.  Objective:  There were no vitals filed for this visit.  Physical Exam Constitutional:      General: He is not in acute distress.    Appearance: Normal appearance.  HENT:     Head: Normocephalic and atraumatic.     Comments: No nits,lice, or hair loss. No cervical, supraclavicular or axillary adenopathy.    Mouth/Throat:     Mouth: Mucous membranes are moist.     Pharynx: Oropharynx is clear. No oropharyngeal exudate or posterior oropharyngeal erythema.  Eyes:     Conjunctiva/sclera: Conjunctivae normal.  Pulmonary:     Effort: Pulmonary effort is normal.  Abdominal:     Palpations: Abdomen is soft. There is no mass.     Tenderness: There is no abdominal tenderness. There is no guarding or rebound.  Genitourinary:    Penis: Normal.      Testes: Normal.     Comments: Pubic area without nits, lice, hair loss,  edema, erythema, lesions and inguinal adenopathy. Penis circumcised without rash, lesions and discharge at meatus. Testicles descended bilaterally,nt, no masses or edema. Musculoskeletal:     Cervical back: Neck supple. No tenderness.  Skin:    General: Skin is warm and dry.     Findings: No bruising, erythema, lesion or rash.  Neurological:     Mental Status: He is alert and oriented to person, place, and time.  Psychiatric:        Mood and Affect: Mood normal.        Behavior: Behavior normal.        Thought Content: Thought content normal.        Judgment: Judgment normal.       Assessment and Plan:  Antonio Blackburn is a 35 y.o. male presenting to the [facility] Department for STI screening  1. Screening for STD (sexually transmitted disease) Patient into clinic with symptoms. Reviewed with patient that Gram stain is normal and no treatment is indicated today. Counseled patient that due to symptoms he describes, he should be evaluated by a PCP to check for hernia or other cause of his symptoms. PCP list given to patient to establish care for evaluation of symptoms, illness and age appropriate screenings. Rec condoms with all sex. Await test results.  Counseled that RN will call if needs to RTC for treatment once results are back. - Gram stain -  Gonococcus culture - HIV [place] [facility] - Syphilis Serology, [facility]     No follow-ups on file.  No future appointments.  Matt Holmes, PA

## 2020-09-17 LAB — GONOCOCCUS CULTURE

## 2020-09-18 LAB — HM HIV SCREENING LAB: HM HIV Screening: NEGATIVE

## 2020-12-12 ENCOUNTER — Other Ambulatory Visit: Payer: Self-pay

## 2020-12-12 ENCOUNTER — Ambulatory Visit
Admission: EM | Admit: 2020-12-12 | Discharge: 2020-12-12 | Disposition: A | Discharge status: Home / Self Care | Payer: 59 | Attending: Emergency Medicine | Admitting: Emergency Medicine

## 2020-12-12 DIAGNOSIS — K4090 Unilateral inguinal hernia, without obstruction or gangrene, not specified as recurrent: Secondary | ICD-10-CM | POA: Diagnosis not present

## 2020-12-12 NOTE — ED Provider Notes (Signed)
[facility]    CSN: [id number] Arrival date & time: 12/12/20  1803      History   Chief Complaint Chief Complaint  Patient presents with   Groin Pain    Left     HPI WIATT MAHABIR is a 34 y.o. male.   HPI  35 year old male here for left Swelling.  Patient reports he has been having left inguinal swelling for the past couple of months that will come and go.  He reports it is mostly worse in the morning but then will get better as the day goes on.  He does a lot of heavy lifting and squatting and has to wear safety harness that rubs right on the same area at work.  He states that he also does have a lot of pulling in that area.  He denies fever or pain in his testicles.  He does state that occasionally the area will swell up and become hard but then it goes down.  Past Medical History:  Diagnosis Date   Clavicle fracture    right    There are no problems to display for this patient.   History reviewed. No pertinent surgical history.     Home Medications    Prior to Admission medications   Not on File    Family History History reviewed. No pertinent family history.  Social History Social History   Tobacco Use   Smoking status: Every Day    Pack years: 0.00    Types: Cigarettes   Smokeless tobacco: Never  Substance Use Topics   Alcohol use: Yes   Drug use: Not Currently    Types: Marijuana     Allergies   Patient has no known allergies.   Review of Systems Review of Systems  Constitutional:  Negative for fever.  Gastrointestinal:  Negative for abdominal pain, nausea and vomiting.  Genitourinary:  Negative for penile discharge, penile pain, scrotal swelling and testicular pain.    Physical Exam Triage Vital Signs ED Triage Vitals  Enc Vitals Group     BP 12/12/20 1816 (!) 143/94     Pulse Rate 12/12/20 1816 66     Resp 12/12/20 1816 18     Temp 12/12/20 1816 99 F (37.2 C)     Temp Source 12/12/20 1816 Oral     SpO2  12/12/20 1816 97 %     Weight 12/12/20 1815 135 lb (61.2 kg)     Height 12/12/20 1815 5\' 11"  (1.803 m)     Head Circumference --      Peak Flow --      Pain Score 12/12/20 1815 8     Pain Loc --      Pain Edu? --      Excl. in GC? --    No data found.  Updated Vital Signs BP (!) 143/94 (BP Location: Right Arm)   Pulse 66   Temp 99 F (37.2 C) (Oral)   Resp 18   Ht 5\' 11"  (1.803 m)   Wt 135 lb (61.2 kg)   SpO2 97%   BMI 18.83 kg/m   Visual Acuity Right Eye Distance:   Left Eye Distance:   Bilateral Distance:    Right Eye Near:   Left Eye Near:    Bilateral Near:     Physical Exam Vitals and nursing note reviewed.  Constitutional:      General: He is not in acute distress.    Appearance: Normal appearance. He is normal  weight. He is not ill-appearing.  HENT:     Head: Normocephalic and atraumatic.  Cardiovascular:     Rate and Rhythm: Normal rate and regular rhythm.     Pulses: Normal pulses.     Heart sounds: Normal heart sounds. No murmur heard.   No gallop.  Pulmonary:     Effort: Pulmonary effort is normal.     Breath sounds: Normal breath sounds. No wheezing, rhonchi or rales.  Abdominal:     Hernia: A hernia is present.  Skin:    General: Skin is warm and dry.     Capillary Refill: Capillary refill takes less than 2 seconds.     Findings: No erythema or rash.  Neurological:     General: No focal deficit present.     Mental Status: He is alert and oriented to person, place, and time.  Psychiatric:        Mood and Affect: Mood normal.        Behavior: Behavior normal.        Thought Content: Thought content normal.        Judgment: Judgment normal.     UC Treatments / Results  Labs (all labs ordered are listed, but only abnormal results are displayed) Labs Reviewed - No data to display  EKG   Radiology No results found.  Procedures Procedures (including critical care time)  Medications Ordered in UC Medications - No data to  display  Initial Impression / Assessment and Plan / UC Course  I have reviewed the triage vital signs and the nursing notes.  Pertinent labs & imaging results that were available during my care of the patient were reviewed by me and considered in my medical decision making (see chart for details).  Patient is a very pleasant and nontoxic-appearing 35 year old male here for evaluation of left inguinal swelling as outlined in the HPI above.  Patient's physical exam reveals a benign cardiopulmonary exam.  Patient's abdominal exam reveals the presence of some left inguinal swelling.  The area is firm but easily reducible.  Patient has a normal testicular exam and there is no indications of on indirect hernia on exam.  Patient's physical exam is consistent with a direct left inguinal hernia.  Will discharge patient home and refer him to general surgery for evaluation.  Patient advised to wear compression shorts, use over-the-counter Tylenol and ibuprofen as needed for pain, and will place patient on light duty until cleared by surgery.  ER precautions reviewed with patient.   Final Clinical Impressions(s) / UC Diagnoses   Final diagnoses:  Direct inguinal hernia of left side     Discharge Instructions      Wear compression shorts to help give support to your inguinal region and help provide pain relief from your hernia.  Use over-the-counter Tylenol and ibuprofen according to the package instructions as needed for mild to moderate pain.  Avoid heavy lifting or straining of more than 10 pounds until evaluated and cleared by general surgery.  If you have any inguinal swelling that becomes hard and tender and you are unable to get it to go down or reduce you need to go to the emergency department for evaluation.     ED Prescriptions   None    PDMP not reviewed this encounter.   Becky Augusta, NP 12/12/20 [phone]

## 2020-12-12 NOTE — Discharge Instructions (Addendum)
Wear compression shorts to help give support to your inguinal region and help provide pain relief from your hernia.  Use over-the-counter Tylenol and ibuprofen according to the package instructions as needed for mild to moderate pain.  Avoid heavy lifting or straining of more than 10 pounds until evaluated and cleared by general surgery.  If you have any inguinal swelling that becomes hard and tender and you are unable to get it to go down or reduce you need to go to the emergency department for evaluation.

## 2020-12-12 NOTE — ED Triage Notes (Signed)
Patient states that he has been having swelling in his left inguinal area x months, worsening recently. States that at works he wears a safety harness that falls in this area, reports that he does a lot of lifting and pulling in this area

## 2020-12-18 ENCOUNTER — Telehealth: Payer: Self-pay | Admitting: Surgery

## 2020-12-18 ENCOUNTER — Other Ambulatory Visit: Payer: Self-pay

## 2020-12-18 ENCOUNTER — Encounter: Payer: Self-pay | Admitting: Surgery

## 2020-12-18 ENCOUNTER — Ambulatory Visit: Payer: Self-pay | Admitting: Surgery

## 2020-12-18 ENCOUNTER — Ambulatory Visit (INDEPENDENT_AMBULATORY_CARE_PROVIDER_SITE_OTHER): Payer: 59 | Admitting: Surgery

## 2020-12-18 VITALS — BP 125/86 | HR 85 | Temp 98.1°F | Ht 71.0 in | Wt 145.6 lb

## 2020-12-18 DIAGNOSIS — K4021 Bilateral inguinal hernia, without obstruction or gangrene, recurrent: Secondary | ICD-10-CM

## 2020-12-18 NOTE — H&P (View-Only) (Signed)
Patient ID: HAYK DIVIS, male   DOB: 05-04-1986, 35 y.o.   MRN: [id number]  Chief Complaint: Left inguinal hernia  History of Present Illness Antonio Blackburn is a 35 y.o. male with a 47-month history of progressively enlarging left groin bulge, becoming increasingly painful to an 8/9 out of 10.  He reports normal bowel habits and no difficulty with voiding.  He is actively involved with lifting carrying bending at his job, does not recall any particular event that precipitated the pain.  It does seem to spontaneously reduce.  No history of nausea, vomiting, fevers or chills.  Past Medical History Past Medical History:  Diagnosis Date   Clavicle fracture    right      History reviewed. No pertinent surgical history.  No Known Allergies  No current outpatient medications on file.   No current facility-administered medications for this visit.    Family History History reviewed. No pertinent family history.    Social History Social History   Tobacco Use   Smoking status: Every Day    Pack years: 0.00    Types: Cigarettes   Smokeless tobacco: Never  Substance Use Topics   Alcohol use: Yes   Drug use: Not Currently    Types: Marijuana        Review of Systems  Constitutional: Negative.   HENT: Negative.    Eyes: Negative.   Respiratory: Negative.    Cardiovascular: Negative.   Gastrointestinal: Negative.   Genitourinary: Negative.   Skin: Negative.   Neurological: Negative.   Psychiatric/Behavioral: Negative.       Physical Exam Blood pressure 125/86, pulse 85, temperature 98.1 F (36.7 C), temperature source Oral, height 5\' 11"  (1.803 m), weight 145 lb 9.6 oz (66 kg), SpO2 96 %. Last Weight  Most recent update: 12/18/2020 10:01 AM    Weight  66 kg (145 lb 9.6 oz)             CONSTITUTIONAL: Well developed, and nourished, lean appearing and fit male, appropriately responsive and aware without distress.   EYES: Sclera non-icteric.   EARS, NOSE, MOUTH AND  THROAT: Mask worn.   The oropharynx is clear. Oral mucosa is pink and moist.     Hearing is intact to voice.  NECK: Trachea is midline, and there is no jugular venous distension.  LYMPH NODES:  Lymph nodes in the neck are not enlarged. RESPIRATORY:  Lungs are clear, and breath sounds are equal bilaterally. Normal respiratory effort without pathologic use of accessory muscles. CARDIOVASCULAR: Heart is regular in rate and rhythm. GI: The abdomen is soft, nontender, and nondistended. There were no palpable masses. I did not appreciate hepatosplenomegaly. There were normal bowel sounds. GU: More prominent left inguinal hernia sac appreciable at the external inguinal opening, much smaller right-sided inguinal hernia appreciated.  Testes distended bilaterally. MUSCULOSKELETAL:  Symmetrical muscle tone appreciated in all four extremities.    SKIN: Skin turgor is normal. No pathologic skin lesions appreciated.  NEUROLOGIC:  Motor and sensation appear grossly normal.  Cranial nerves are grossly without defect. PSYCH:  Alert and oriented to person, place and time. Affect is appropriate for situation.  Data Reviewed I have personally reviewed what is currently available of the patient's imaging, recent labs and medical records.   Labs:  Last labs 2013, all within normal limits.  Imaging:  Within last 24 hrs: No results found.  Assessment    Bilateral inguinal hernias, left side is symptomatic. There are no problems to display for this  patient.   Plan    We discussed the role of repairing the asymptomatic side, versus deferring it until it became symptomatic.  Considering the workload that he is currently employed with I suspect he will benefit by having both sides repaired in one setting.  We discussed that we may create symptoms where he currently does not have much discomfort, although he did allude to having some right-sided discomfort upon direct questioning. Robotic repair of bilateral  inguinal hernias. I discussed possibility of incarceration, strangulation, enlargement in size over time, and the need for emergency surgery in the face of these.  Also reviewed the techniques of reduction should incarceration occur, and when unsuccessful to present to the ED.  Also discussed that surgery risks include recurrence which can be up to 30% in the case of complex hernias, use of prosthetic materials (mesh) and the increased risk of infection and the possible need for re-operation and removal of mesh, possibility of post-op SBO or ileus, and the risks of general anesthetic including heart attack, stroke, sudden death or some reaction to anesthetic medications. The patient, and those present, appear to understand the risks, any and all questions were answered to the patient's satisfaction.  No guarantees were ever expressed or implied.   Face-to-face time spent with the patient and accompanying care providers(if present) was 30 minutes, with more than 50% of the time spent counseling, educating, and coordinating care of the patient.    These notes generated with voice recognition software. I apologize for typographical errors.  Imonie Tuch M.D., FACS 12/18/2020, 10:35 AM     

## 2020-12-18 NOTE — Patient Instructions (Signed)
Our surgery scheduler Barbara will call you within 24-48 hours to get you scheduled. If you have not heard from her after 48 hours, please call our office. You will not need to get Covid tested before surgery and have the blue sheet available when she calls to write down important information.  If you have any concerns or questions, please feel free to call our office.   Inguinal Hernia, Adult An inguinal hernia is when fat or your intestines push through a weak spot in a muscle where your leg meets your lower belly (groin). This causes a bulge. This kind of hernia could also be: In your scrotum, if you are male. In folds of skin around your vagina, if you are male. There are three types of inguinal hernias: Hernias that can be pushed back into the belly (are reducible). This type rarely causes pain. Hernias that cannot be pushed back into the belly (are incarcerated). Hernias that cannot be pushed back into the belly and lose their blood supply (are strangulated). This type needs emergency surgery. What are the causes? This condition is caused by having a weak spot in the muscles or tissues in your groin. This develops over time. The hernia may poke through the weak spot when you strain your lower belly muscles all of a sudden, such as when you: Lift a heavy object. Strain to poop (have a bowel movement). Trouble pooping (constipation) can lead to straining. Cough. What increases the risk? This condition is more likely to develop in: Males. Pregnant females. People who: Are overweight. Work in jobs that require long periods of standing or heavy lifting. Have had an inguinal hernia before. Smoke or have lung disease. These factors can lead to long-term (chronic) coughing. What are the signs or symptoms? Symptoms may depend on the size of the hernia. Often, a small hernia has no symptoms. Symptoms of a larger hernia may include: A bulge in the groin area. This is easier to see when  standing. You might not be able to see it when you are lying down. Pain or burning in the groin. This may get worse when you lift, strain, or cough. A dull ache or a feeling of pressure in the groin. An abnormal bulge in the scrotum, in males. Symptoms of a strangulated inguinal hernia may include: A bulge in your groin that is very painful and tender to the touch. A bulge that turns red or purple. Fever, feeling like you may vomit (nausea), and vomiting. Not being able to poop or to pass gas. How is this treated? Treatment depends on the size of your hernia and whether you have symptoms. If you do not have symptoms, your doctor may have you watch your hernia carefully and have you come in for follow-up visits. If your hernia is large or if you have symptoms, you may need surgery to repair the hernia. Follow these instructions at home: Lifestyle Avoid lifting heavy objects. Avoid standing for long amounts of time. Do not smoke or use any products that contain nicotine or tobacco. If you need help quitting, ask your doctor. Stay at a healthy weight. Prevent trouble pooping You may need to take these actions to prevent or treat trouble pooping: Drink enough fluid to keep your pee (urine) pale yellow. Take over-the-counter or prescription medicines. Eat foods that are high in fiber. These include beans, whole grains, and fresh fruits and vegetables. Limit foods that are high in fat and sugar. These include fried or sweet foods. General   instructions You may try to push your hernia back in place by very gently pressing on it when you are lying down. Do not try to push the bulge back in if it will not go in easily. Watch your hernia for any changes in shape, size, or color. Tell your doctor if you see any changes. Take over-the-counter and prescription medicines only as told by your doctor. Keep all follow-up visits. Contact a doctor if: You have a fever or chills. You have new  symptoms. Your symptoms get worse. Get help right away if: You have pain in your groin that gets worse all of a sudden. You have a bulge in your groin that: Gets bigger all of a sudden, and it does not get smaller after that. Turns red or purple. Is painful when you touch it. You are a male, and you have: Sudden pain in your scrotum. A sudden change in the size of your scrotum. You cannot push the hernia back in place by very gently pressing on it when you are lying down. You feel like you may vomit, and that feeling does not go away. You keep vomiting. You have a fast heartbeat. You cannot poop or pass gas. These symptoms may be an emergency. Get help right away. Call your local emergency services (911 in the [place] Do not wait to see if the symptoms will go away. Do not drive yourself to the hospital. Summary An inguinal hernia is when fat or your intestines push through a weak spot in a muscle where your leg meets your lower belly (groin). This causes a bulge. If you do not have symptoms, you may not need treatment. If you have symptoms or a large hernia, you may need surgery. Avoid lifting heavy objects. Also, avoid standing for long amounts of time. Do not try to push the bulge back in if it will not go in easily. This information is not intended to replace advice given to you by your health care provider. Make sure you discuss any questions you have with your health care provider. Document Revised: 01/24/2020 Document Reviewed: 01/24/2020 Elsevier Patient Education  2022 [employer]  

## 2020-12-18 NOTE — Progress Notes (Signed)
Patient ID: Antonio Blackburn, male   DOB: 05-04-1986, 35 y.o.   MRN: [id number]  Chief Complaint: Left inguinal hernia  History of Present Illness Antonio Blackburn is a 35 y.o. male with a 47-month history of progressively enlarging left groin bulge, becoming increasingly painful to an 8/9 out of 10.  He reports normal bowel habits and no difficulty with voiding.  He is actively involved with lifting carrying bending at his job, does not recall any particular event that precipitated the pain.  It does seem to spontaneously reduce.  No history of nausea, vomiting, fevers or chills.  Past Medical History Past Medical History:  Diagnosis Date   Clavicle fracture    right      History reviewed. No pertinent surgical history.  No Known Allergies  No current outpatient medications on file.   No current facility-administered medications for this visit.    Family History History reviewed. No pertinent family history.    Social History Social History   Tobacco Use   Smoking status: Every Day    Pack years: 0.00    Types: Cigarettes   Smokeless tobacco: Never  Substance Use Topics   Alcohol use: Yes   Drug use: Not Currently    Types: Marijuana        Review of Systems  Constitutional: Negative.   HENT: Negative.    Eyes: Negative.   Respiratory: Negative.    Cardiovascular: Negative.   Gastrointestinal: Negative.   Genitourinary: Negative.   Skin: Negative.   Neurological: Negative.   Psychiatric/Behavioral: Negative.       Physical Exam Blood pressure 125/86, pulse 85, temperature 98.1 F (36.7 C), temperature source Oral, height 5\' 11"  (1.803 m), weight 145 lb 9.6 oz (66 kg), SpO2 96 %. Last Weight  Most recent update: 12/18/2020 10:01 AM    Weight  66 kg (145 lb 9.6 oz)             CONSTITUTIONAL: Well developed, and nourished, lean appearing and fit male, appropriately responsive and aware without distress.   EYES: Sclera non-icteric.   EARS, NOSE, MOUTH AND  THROAT: Mask worn.   The oropharynx is clear. Oral mucosa is pink and moist.     Hearing is intact to voice.  NECK: Trachea is midline, and there is no jugular venous distension.  LYMPH NODES:  Lymph nodes in the neck are not enlarged. RESPIRATORY:  Lungs are clear, and breath sounds are equal bilaterally. Normal respiratory effort without pathologic use of accessory muscles. CARDIOVASCULAR: Heart is regular in rate and rhythm. GI: The abdomen is soft, nontender, and nondistended. There were no palpable masses. I did not appreciate hepatosplenomegaly. There were normal bowel sounds. GU: More prominent left inguinal hernia sac appreciable at the external inguinal opening, much smaller right-sided inguinal hernia appreciated.  Testes distended bilaterally. MUSCULOSKELETAL:  Symmetrical muscle tone appreciated in all four extremities.    SKIN: Skin turgor is normal. No pathologic skin lesions appreciated.  NEUROLOGIC:  Motor and sensation appear grossly normal.  Cranial nerves are grossly without defect. PSYCH:  Alert and oriented to person, place and time. Affect is appropriate for situation.  Data Reviewed I have personally reviewed what is currently available of the patient's imaging, recent labs and medical records.   Labs:  Last labs 2013, all within normal limits.  Imaging:  Within last 24 hrs: No results found.  Assessment    Bilateral inguinal hernias, left side is symptomatic. There are no problems to display for this  patient.   Plan    We discussed the role of repairing the asymptomatic side, versus deferring it until it became symptomatic.  Considering the workload that he is currently employed with I suspect he will benefit by having both sides repaired in one setting.  We discussed that we may create symptoms where he currently does not have much discomfort, although he did allude to having some right-sided discomfort upon direct questioning. Robotic repair of bilateral  inguinal hernias. I discussed possibility of incarceration, strangulation, enlargement in size over time, and the need for emergency surgery in the face of these.  Also reviewed the techniques of reduction should incarceration occur, and when unsuccessful to present to the ED.  Also discussed that surgery risks include recurrence which can be up to 30% in the case of complex hernias, use of prosthetic materials (mesh) and the increased risk of infection and the possible need for re-operation and removal of mesh, possibility of post-op SBO or ileus, and the risks of general anesthetic including heart attack, stroke, sudden death or some reaction to anesthetic medications. The patient, and those present, appear to understand the risks, any and all questions were answered to the patient's satisfaction.  No guarantees were ever expressed or implied.   Face-to-face time spent with the patient and accompanying care providers(if present) was 30 minutes, with more than 50% of the time spent counseling, educating, and coordinating care of the patient.    These notes generated with voice recognition software. I apologize for typographical errors.  Campbell Lerner M.D., FACS 12/18/2020, 10:35 AM

## 2020-12-18 NOTE — Telephone Encounter (Signed)
Outgoing call is made, left message for patient to call.  Please inform patient of Pre-Admission date/time, COVID Testing date and Surgery date.  Surgery Date: 12/26/20 Preadmission Testing Date: 12/21/20 (phone 8a-1p) Covid Testing Date: Not needed.    Also patient needs to call at [phone], between 1-3:00pm the day before surgery, to find out what time to arrive for surgery.

## 2020-12-19 NOTE — Telephone Encounter (Signed)
Incoming call from patient.  He is now aware of all dates and information regarding his surgery and verbalized understanding.  

## 2020-12-21 ENCOUNTER — Encounter
Admission: RE | Admit: 2020-12-21 | Discharge: 2020-12-21 | Disposition: A | Discharge status: Home / Self Care | Payer: 59 | Source: Ambulatory Visit | Attending: Surgery | Admitting: Surgery

## 2020-12-21 ENCOUNTER — Other Ambulatory Visit: Payer: Self-pay

## 2020-12-21 HISTORY — DX: Unspecified asthma, uncomplicated: J45.909

## 2020-12-21 HISTORY — DX: Gastro-esophageal reflux disease without esophagitis: K21.9

## 2020-12-21 NOTE — Patient Instructions (Addendum)
Your procedure is scheduled on:12-26-20 Wednesday Report to the Registration Desk on the 1st floor of the [facility].Then proceed to the 2nd floor Surgery Desk in the Medical [facility] To find out your arrival time, please call [phone] between 1PM - 3PM on:12-25-20 Tuesday  REMEMBER: Instructions that are not followed completely may result in serious medical risk, up to and including death; or upon the discretion of your surgeon and anesthesiologist your surgery may need to be rescheduled.  Do not eat food after midnight the night before surgery.  No gum chewing, lozengers or hard candies.  You may however, drink CLEAR liquids up to 2 hours before you are scheduled to arrive for your surgery. Do not drink anything within 2 hours of your scheduled arrival time.  Clear liquids include: - water  - apple juice without pulp - gatorade (not RED, PURPLE, OR BLUE) - black coffee or tea (Do NOT add milk or creamers to the coffee or tea) Do NOT drink anything that is not on this list.  Do not take any medication the day of surgery  One week prior to surgery: Stop Anti-inflammatories (NSAIDS) such as Advil, Aleve, Ibuprofen, Motrin, Naproxen, Naprosyn and Aspirin based products such as Excedrin, Goodys Powder, BC Powder.You may however, continue to take Tylenol if needed for pain up until the day of surgery.  Stop ANY OVER THE COUNTER supplements/vitamins NOW (12-21-20) until after surgery (Multivitamin)  No Alcohol for 24 hours before or after surgery.  No Smoking including e-cigarettes for 24 hours prior to surgery.  No chewable tobacco products for at least 6 hours prior to surgery.  No nicotine patches on the day of surgery.  Do not use any "recreational" drugs for at least a week prior to your surgery.  Please be advised that the combination of cocaine and anesthesia may have negative outcomes, up to and including death. If you test positive for cocaine, your surgery will be  cancelled.  On the morning of surgery brush your teeth with toothpaste and water, you may rinse your mouth with mouthwash if you wish. Do not swallow any toothpaste or mouthwash.  Do not wear jewelry, make-up, hairpins, clips or nail polish.  Do not wear lotions, powders, or perfumes.   Do not shave body from the neck down 48 hours prior to surgery just in case you cut yourself which could leave a site for infection.  Also, freshly shaved skin may become irritated if using the CHG soap.  Contact lenses, hearing aids and dentures may not be worn into surgery.  Do not bring valuables to the hospital. [facility] is not responsible for any missing/lost belongings or valuables.   Use CHG Soap as directed on instruction sheet.   Notify your doctor if there is any change in your medical condition (cold, fever, infection).  Wear comfortable clothing (specific to your surgery type) to the hospital.  After surgery, you can help prevent lung complications by doing breathing exercises.  Take deep breaths and cough every 1-2 hours. Your doctor may order a device called an Incentive Spirometer to help you take deep breaths. When coughing or sneezing, hold a pillow firmly against your incision with both hands. This is called "splinting." Doing this helps protect your incision. It also decreases belly discomfort.  If you are being admitted to the hospital overnight, leave your suitcase in the car. After surgery it may be brought to your room.  If you are being discharged the day of surgery, you will  not be allowed to drive home. You will need a responsible adult (18 years or older) to drive you home and stay with you that night.   If you are taking public transportation, you will need to have a responsible adult (18 years or older) with you. Please confirm with your physician that it is acceptable to use public transportation.   Please call the Pre-admissions Testing Dept. at [phone] if  you have any questions about these instructions.  Surgery Visitation Policy:  Patients undergoing a surgery or procedure may have one family member or support person with them as long as that person is not COVID-19 positive or experiencing its symptoms.  That person may remain in the waiting area during the procedure.  Inpatient Visitation:    Visiting hours are 7 a.m. to 8 p.m. Inpatients will be allowed two visitors daily. The visitors may change each day during the patient's stay. No visitors under the age of 55. Any visitor under the age of 58 must be accompanied by an adult. The visitor must pass COVID-19 screenings, use hand sanitizer when entering and exiting the patient's room and wear a mask at all times, including in the patient's room. Patients must also wear a mask when staff or their visitor are in the room. Masking is required regardless of vaccination status.

## 2020-12-24 ENCOUNTER — Telehealth: Payer: Self-pay | Admitting: *Deleted

## 2020-12-24 NOTE — Telephone Encounter (Signed)
Faxed FMLA to [employer] at [phone] 

## 2020-12-25 MED ORDER — BUPIVACAINE LIPOSOME 1.3 % IJ SUSP
20.0000 mL | Freq: Once | INTRAMUSCULAR | Status: DC
Start: 2020-12-25 — End: 2020-12-26

## 2020-12-25 MED ORDER — CELECOXIB 200 MG PO CAPS: 200.0000 mg | ORAL_CAPSULE | ORAL | Status: AC

## 2020-12-25 MED ORDER — ORAL CARE MOUTH RINSE: 15.0000 mL | Freq: Once | OROMUCOSAL | Status: AC

## 2020-12-25 MED ORDER — CHLORHEXIDINE GLUCONATE 0.12 % MT SOLN: 15.0000 mL | Freq: Once | OROMUCOSAL | Status: AC

## 2020-12-25 MED ORDER — FAMOTIDINE 20 MG PO TABS
20.0000 mg | ORAL_TABLET | Freq: Once | ORAL | Status: AC
  Administered 2020-12-26: 20 mg via ORAL

## 2020-12-25 MED ORDER — CHLORHEXIDINE GLUCONATE CLOTH 2 % EX PADS: 6.0000 | MEDICATED_PAD | Freq: Once | CUTANEOUS | Status: DC

## 2020-12-25 MED ORDER — GABAPENTIN 300 MG PO CAPS: 300.0000 mg | ORAL_CAPSULE | ORAL | Status: AC

## 2020-12-25 MED ORDER — LACTATED RINGERS IV SOLN: INTRAVENOUS | Status: DC

## 2020-12-25 MED ORDER — CEFAZOLIN SODIUM-DEXTROSE 2-4 GM/100ML-% IV SOLN
2.0000 g | INTRAVENOUS | Status: AC
Start: 2020-12-26 — End: 2020-12-26
  Administered 2020-12-26: 2 g via INTRAVENOUS

## 2020-12-25 MED ORDER — ACETAMINOPHEN 500 MG PO TABS
1000.0000 mg | ORAL_TABLET | ORAL | Status: AC
Start: 2020-12-26 — End: 2020-12-26
  Administered 2020-12-26: 1000 mg via ORAL

## 2020-12-26 ENCOUNTER — Encounter
Admission: RE | Disposition: A | Discharge status: Home / Self Care | Payer: Self-pay | Source: Home / Self Care | Attending: Surgery

## 2020-12-26 ENCOUNTER — Ambulatory Visit: Payer: 59 | Admitting: Certified Registered Nurse Anesthetist

## 2020-12-26 ENCOUNTER — Ambulatory Visit
Admission: RE | Admit: 2020-12-26 | Discharge: 2020-12-26 | Disposition: A | Discharge status: Home / Self Care | Payer: 59 | Attending: Surgery | Admitting: Surgery

## 2020-12-26 ENCOUNTER — Encounter: Payer: Self-pay | Admitting: Surgery

## 2020-12-26 ENCOUNTER — Other Ambulatory Visit: Payer: Self-pay

## 2020-12-26 DIAGNOSIS — K4020 Bilateral inguinal hernia, without obstruction or gangrene, not specified as recurrent: Secondary | ICD-10-CM

## 2020-12-26 DIAGNOSIS — F17210 Nicotine dependence, cigarettes, uncomplicated: Secondary | ICD-10-CM | POA: Insufficient documentation

## 2020-12-26 DIAGNOSIS — K4021 Bilateral inguinal hernia, without obstruction or gangrene, recurrent: Secondary | ICD-10-CM

## 2020-12-26 HISTORY — PX: XI ROBOTIC ASSISTED INGUINAL HERNIA REPAIR WITH MESH: SHX6706

## 2020-12-26 LAB — URINE DRUG SCREEN, QUALITATIVE (ARMC ONLY)
Amphetamines, Ur Screen: NOT DETECTED
Barbiturates, Ur Screen: NOT DETECTED
Benzodiazepine, Ur Scrn: NOT DETECTED
Cannabinoid 50 Ng, Ur ~~LOC~~: POSITIVE — AB
Cocaine Metabolite,Ur ~~LOC~~: NOT DETECTED
MDMA (Ecstasy)Ur Screen: NOT DETECTED
Methadone Scn, Ur: NOT DETECTED
Opiate, Ur Screen: NOT DETECTED
Phencyclidine (PCP) Ur S: NOT DETECTED
Tricyclic, Ur Screen: NOT DETECTED

## 2020-12-26 SURGERY — REPAIR, HERNIA, INGUINAL, ROBOT-ASSISTED, LAPAROSCOPIC, USING MESH
Anesthesia: General | Laterality: Bilateral

## 2020-12-26 MED ORDER — ONDANSETRON HCL 4 MG/2ML IJ SOLN
INTRAMUSCULAR | Status: DC | PRN
  Administered 2020-12-26: 4 mg via INTRAVENOUS

## 2020-12-26 MED ORDER — HYDRALAZINE HCL 20 MG/ML IJ SOLN: 10.0000 mg | Freq: Once | INTRAMUSCULAR | Status: AC

## 2020-12-26 MED ORDER — SODIUM CHLORIDE FLUSH 0.9 % IV SOLN
INTRAVENOUS | Status: AC
  Filled 2020-12-26: qty 10

## 2020-12-26 MED ORDER — DEXAMETHASONE SODIUM PHOSPHATE 10 MG/ML IJ SOLN
INTRAMUSCULAR | Status: DC | PRN
  Administered 2020-12-26: 10 mg via INTRAVENOUS

## 2020-12-26 MED ORDER — SUGAMMADEX SODIUM 200 MG/2ML IV SOLN
INTRAVENOUS | Status: DC | PRN
  Administered 2020-12-26: 200 mg via INTRAVENOUS

## 2020-12-26 MED ORDER — ACETAMINOPHEN 500 MG PO TABS
ORAL_TABLET | ORAL | Status: AC
  Filled 2020-12-26: qty 2

## 2020-12-26 MED ORDER — PROPOFOL 10 MG/ML IV BOLUS
INTRAVENOUS | Status: DC | PRN
Start: 2020-12-26 — End: 2020-12-26
  Administered 2020-12-26: 170 mg via INTRAVENOUS

## 2020-12-26 MED ORDER — OXYCODONE HCL 5 MG PO TABS
ORAL_TABLET | ORAL | Status: AC
  Administered 2020-12-26: 5 mg via ORAL
  Filled 2020-12-26: qty 1

## 2020-12-26 MED ORDER — FENTANYL CITRATE (PF) 100 MCG/2ML IJ SOLN
INTRAMUSCULAR | Status: DC | PRN
  Administered 2020-12-26 (×2): 50 ug via INTRAVENOUS

## 2020-12-26 MED ORDER — MEPERIDINE HCL 25 MG/ML IJ SOLN: 6.2500 mg | INTRAMUSCULAR | Status: DC | PRN

## 2020-12-26 MED ORDER — CHLORHEXIDINE GLUCONATE 0.12 % MT SOLN
OROMUCOSAL | Status: AC
  Administered 2020-12-26: 15 mL via OROMUCOSAL
  Filled 2020-12-26: qty 15

## 2020-12-26 MED ORDER — PROPOFOL 10 MG/ML IV BOLUS
INTRAVENOUS | Status: AC
  Filled 2020-12-26: qty 40

## 2020-12-26 MED ORDER — IBUPROFEN 800 MG PO TABS: 800.0000 mg | ORAL_TABLET | Freq: Three times a day (TID) | ORAL | 0 refills | Status: DC | PRN

## 2020-12-26 MED ORDER — OXYCODONE HCL 5 MG PO TABS: 5.0000 mg | ORAL_TABLET | Freq: Once | ORAL | Status: AC

## 2020-12-26 MED ORDER — ONDANSETRON HCL 4 MG/2ML IJ SOLN: 4.0000 mg | Freq: Once | INTRAMUSCULAR | Status: DC | PRN

## 2020-12-26 MED ORDER — GABAPENTIN 300 MG PO CAPS
ORAL_CAPSULE | ORAL | Status: AC
  Administered 2020-12-26: 300 mg via ORAL
  Filled 2020-12-26: qty 1

## 2020-12-26 MED ORDER — LIDOCAINE HCL (CARDIAC) PF 100 MG/5ML IV SOSY
PREFILLED_SYRINGE | INTRAVENOUS | Status: DC | PRN
  Administered 2020-12-26: 100 mg via INTRAVENOUS

## 2020-12-26 MED ORDER — CEFAZOLIN SODIUM-DEXTROSE 2-4 GM/100ML-% IV SOLN
INTRAVENOUS | Status: AC
  Filled 2020-12-26: qty 100

## 2020-12-26 MED ORDER — MIDAZOLAM HCL 2 MG/2ML IJ SOLN
INTRAMUSCULAR | Status: DC | PRN
  Administered 2020-12-26: 2 mg via INTRAVENOUS

## 2020-12-26 MED ORDER — BUPIVACAINE-EPINEPHRINE (PF) 0.25% -1:200000 IJ SOLN
INTRAMUSCULAR | Status: DC | PRN
  Administered 2020-12-26: 30 mL

## 2020-12-26 MED ORDER — ROCURONIUM BROMIDE 100 MG/10ML IV SOLN
INTRAVENOUS | Status: DC | PRN
  Administered 2020-12-26: 50 mg via INTRAVENOUS

## 2020-12-26 MED ORDER — CELECOXIB 200 MG PO CAPS
ORAL_CAPSULE | ORAL | Status: AC
  Administered 2020-12-26: 200 mg via ORAL
  Filled 2020-12-26: qty 1

## 2020-12-26 MED ORDER — PHENYLEPHRINE HCL (PRESSORS) 10 MG/ML IV SOLN
INTRAVENOUS | Status: AC
  Filled 2020-12-26: qty 1

## 2020-12-26 MED ORDER — FENTANYL CITRATE (PF) 100 MCG/2ML IJ SOLN
25.0000 ug | INTRAMUSCULAR | Status: DC | PRN
  Administered 2020-12-26 (×2): 25 ug via INTRAVENOUS

## 2020-12-26 MED ORDER — HYDRALAZINE HCL 20 MG/ML IJ SOLN
INTRAMUSCULAR | Status: AC
  Administered 2020-12-26: 10 mg via INTRAVENOUS
  Filled 2020-12-26: qty 1

## 2020-12-26 MED ORDER — FENTANYL CITRATE (PF) 100 MCG/2ML IJ SOLN
INTRAMUSCULAR | Status: AC
  Filled 2020-12-26: qty 2

## 2020-12-26 MED ORDER — FAMOTIDINE 20 MG PO TABS
ORAL_TABLET | ORAL | Status: AC
  Filled 2020-12-26: qty 1

## 2020-12-26 MED ORDER — MIDAZOLAM HCL 2 MG/2ML IJ SOLN
INTRAMUSCULAR | Status: AC
  Filled 2020-12-26: qty 2

## 2020-12-26 MED ORDER — HYDROCODONE-ACETAMINOPHEN 5-325 MG PO TABS: 1.0000 | ORAL_TABLET | Freq: Four times a day (QID) | ORAL | 0 refills | Status: DC | PRN

## 2020-12-26 SURGICAL SUPPLY — 50 items
ADH SKN CLS APL DERMABOND .7 (GAUZE/BANDAGES/DRESSINGS) ×1
APL PRP STRL LF DISP 70% ISPRP (MISCELLANEOUS) ×1
BLADE CLIPPER SURG (BLADE) ×2 IMPLANT
CANISTER SUCT 1200ML W/VALVE (MISCELLANEOUS) IMPLANT
CANNULA CAP OBTURATR AIRSEAL 8 (CAP) ×2 IMPLANT
CHLORAPREP W/TINT 26 (MISCELLANEOUS) ×2 IMPLANT
COVER TIP SHEARS 8 DVNC (MISCELLANEOUS) ×1 IMPLANT
COVER TIP SHEARS 8MM DA VINCI (MISCELLANEOUS) ×1
COVER WAND RF STERILE (DRAPES) ×2 IMPLANT
DEFOGGER SCOPE WARMER CLEARIFY (MISCELLANEOUS) ×2 IMPLANT
DERMABOND ADVANCED (GAUZE/BANDAGES/DRESSINGS) ×1
DERMABOND ADVANCED .7 DNX12 (GAUZE/BANDAGES/DRESSINGS) ×1 IMPLANT
DRAPE 3/4 80X56 (DRAPES) IMPLANT
DRAPE ARM DVNC X/XI (DISPOSABLE) ×3 IMPLANT
DRAPE COLUMN DVNC XI (DISPOSABLE) ×1 IMPLANT
DRAPE DA VINCI XI ARM (DISPOSABLE) ×3
DRAPE DA VINCI XI COLUMN (DISPOSABLE) ×1
ELECT REM PT RETURN 9FT ADLT (ELECTROSURGICAL) ×2
ELECTRODE REM PT RTRN 9FT ADLT (ELECTROSURGICAL) ×1 IMPLANT
GAUZE 4X4 16PLY ~~LOC~~+RFID DBL (SPONGE) ×2 IMPLANT
GLOVE SURG ORTHO LTX SZ7.5 (GLOVE) ×6 IMPLANT
GOWN STRL REUS W/ TWL LRG LVL3 (GOWN DISPOSABLE) ×3 IMPLANT
GOWN STRL REUS W/TWL LRG LVL3 (GOWN DISPOSABLE) ×6
GRASPER SUT TROCAR 14GX15 (MISCELLANEOUS) IMPLANT
IRRIGATION STRYKERFLOW (MISCELLANEOUS) IMPLANT
IRRIGATOR STRYKERFLOW (MISCELLANEOUS)
IV CATH ANGIO 14GX1.88 NO SAFE (IV SOLUTION) ×2 IMPLANT
IV NS 1000ML (IV SOLUTION)
IV NS 1000ML BAXH (IV SOLUTION) IMPLANT
KIT PINK PAD W/HEAD ARE REST (MISCELLANEOUS) ×2
KIT PINK PAD W/HEAD ARM REST (MISCELLANEOUS) ×1 IMPLANT
LABEL OR SOLS (LABEL) ×2 IMPLANT
MANIFOLD NEPTUNE II (INSTRUMENTS) IMPLANT
MESH 3DMAX LIGHT 4.1X6.2 LT LR (Mesh General) ×2 IMPLANT
MESH 3DMAX LIGHT 4.1X6.2 RT LR (Mesh General) ×2 IMPLANT
NEEDLE HYPO 22GX1.5 SAFETY (NEEDLE) ×2 IMPLANT
NEEDLE INSUFFLATION 14GA 120MM (NEEDLE) ×2 IMPLANT
PACK LAP CHOLECYSTECTOMY (MISCELLANEOUS) ×2 IMPLANT
SEAL CANN UNIV 5-8 DVNC XI (MISCELLANEOUS) ×2 IMPLANT
SEAL XI 5MM-8MM UNIVERSAL (MISCELLANEOUS) ×2
SET TUBE FILTERED XL AIRSEAL (SET/KITS/TRAYS/PACK) ×2 IMPLANT
SOLUTION ELECTROLUBE (MISCELLANEOUS) ×2 IMPLANT
SUT MNCRL 4-0 (SUTURE) ×2
SUT MNCRL 4-0 27XMFL (SUTURE) ×1
SUT V-LOC 90 ABS DVC 3-0 CL (SUTURE) IMPLANT
SUT VIC AB 0 CT2 27 (SUTURE) ×2 IMPLANT
SUT VICRYL 0 AB UR-6 (SUTURE) ×2 IMPLANT
SUT VLOC 90 S/L VL9 GS22 (SUTURE) ×2 IMPLANT
SUTURE MNCRL 4-0 27XMF (SUTURE) ×1 IMPLANT
TROCAR Z-THREAD FIOS 11X100 BL (TROCAR) IMPLANT

## 2020-12-26 NOTE — Interval H&P Note (Signed)
History and Physical Interval Note:  12/26/2020 8:19 AM  Antonio Blackburn  has presented today for surgery, with the diagnosis of bilateral inguinal hernias.  The various methods of treatment have been discussed with the patient and family. After consideration of risks, benefits and other options for treatment, the patient has consented to  Procedure(s): XI ROBOTIC ASSISTED INGUINAL HERNIA REPAIR WITH MESH, bilateral (Bilateral) as a surgical intervention.  The patient's history has been reviewed, patient examined, no change in status, stable for surgery.  I have reviewed the patient's chart and labs.  Questions were answered to the patient's satisfaction.     Campbell Lerner

## 2020-12-26 NOTE — Progress Notes (Signed)
Patient voided prior to discharge from phase II. Anesthesia examined patient prior to discharge, noted upper eyelids "puffy" No new orders, just monitor. Call provider if increased swelling noted. Patient verbalizes understanding.

## 2020-12-26 NOTE — Anesthesia Preprocedure Evaluation (Signed)
Anesthesia Evaluation  Patient identified by MRN, date of birth, ID band Patient awake    Reviewed: Allergy & Precautions, NPO status , Patient's Chart, lab work & pertinent test results  Airway Mallampati: II  TM Distance: >3 FB Neck ROM: Full    Dental no notable dental hx.    Pulmonary asthma , Current Smoker and Patient abstained from smoking.,    Pulmonary exam normal        Cardiovascular negative cardio ROS Normal cardiovascular exam     Neuro/Psych negative neurological ROS  negative psych ROS   GI/Hepatic Neg liver ROS, GERD  ,  Endo/Other  negative endocrine ROS  Renal/GU negative Renal ROS  negative genitourinary   Musculoskeletal negative musculoskeletal ROS (+)   Abdominal   Peds negative pediatric ROS (+)  Hematology negative hematology ROS (+)   Anesthesia Other Findings   Reproductive/Obstetrics negative OB ROS                             Anesthesia Physical Anesthesia Plan  ASA: 2  Anesthesia Plan: General   Post-op Pain Management:    Induction: Intravenous  PONV Risk Score and Plan: 2 and Propofol infusion and Midazolam  Airway Management Planned: Oral ETT  Additional Equipment:   Intra-op Plan:   Post-operative Plan:   Informed Consent: I have reviewed the patients History and Physical, chart, labs and discussed the procedure including the risks, benefits and alternatives for the proposed anesthesia with the patient or authorized representative who has indicated his/her understanding and acceptance.       Plan Discussed with: CRNA, Anesthesiologist and Surgeon  Anesthesia Plan Comments:         Anesthesia Quick Evaluation

## 2020-12-26 NOTE — Discharge Instructions (Addendum)
AMBULATORY SURGERY  DISCHARGE INSTRUCTIONS   The drugs that you were given will stay in your system until tomorrow so for the next 24 hours you should not:  Drive an automobile Make any legal decisions Drink any alcoholic beverage   You may resume regular meals tomorrow.  Today it is better to start with liquids and gradually work up to solid foods.  You may eat anything you prefer, but it is better to start with liquids, then soup and crackers, and gradually work up to solid foods.   Please notify your doctor immediately if you have any unusual bleeding, trouble breathing, redness and pain at the surgery site, drainage, fever, or pain not relieved by medication.    Additional Instructions:        Please contact your physician with any problems or Same Day Surgery at [phone], Monday through Friday 6 am to 4 pm, or [facility] at [facility] number at [phone].    Since you were unable to urinate, we will need for you to go to the Emergency Department if you cannot urinate or you are not emptying your bladder within 4 hours of leaving the hospital and/or having abdominal distension. 

## 2020-12-26 NOTE — Anesthesia Procedure Notes (Signed)
Procedure Name: Intubation Date/Time: 12/26/2020 8:38 AM Performed by: Manfred Arch, MD Pre-anesthesia Checklist: Patient identified, Patient being monitored, Timeout performed, Emergency Drugs available and Suction available Patient Re-evaluated:Patient Re-evaluated prior to induction Oxygen Delivery Method: Circle system utilized Preoxygenation: Pre-oxygenation with 100% oxygen Induction Type: IV induction Ventilation: Mask ventilation without difficulty Laryngoscope Size: 3 and McGraph Grade View: Grade I Tube type: Oral Tube size: 7.5 mm Number of attempts: 1 Airway Equipment and Method: Stylet Placement Confirmation: ETT inserted through vocal cords under direct vision, positive ETCO2 and breath sounds checked- equal and bilateral Secured at: 22 cm Tube secured with: Tape Dental Injury: Teeth and Oropharynx as per pre-operative assessment

## 2020-12-26 NOTE — Addendum Note (Signed)
Addendum  created 12/26/20 1303 by Manfred Arch, MD   Clinical Note Signed

## 2020-12-26 NOTE — Anesthesia Postprocedure Evaluation (Signed)
Anesthesia Post Note  Patient: Antonio Blackburn  Procedure(s) Performed: XI ROBOTIC ASSISTED INGUINAL HERNIA REPAIR WITH MESH, BILATERAL (Bilateral)  Patient location during evaluation: PACU Anesthesia Type: General Level of consciousness: sedated Pain management: pain level controlled Vital Signs Assessment: post-procedure vital signs reviewed and stable Respiratory status: spontaneous breathing, nonlabored ventilation, respiratory function stable and patient connected to face mask oxygen Cardiovascular status: blood pressure returned to baseline and stable Postop Assessment: no apparent nausea or vomiting Anesthetic complications: no   No notable events documented.   Last Vitals:  Vitals:   12/26/20 0731  BP: (!) 140/94  Pulse: (!) 58  Resp: 16  Temp: (!) 36.4 C  SpO2: 99%    Last Pain:  Vitals:   12/26/20 0731  TempSrc: Temporal  PainSc: 0-No pain                 Diantha Paxson

## 2020-12-26 NOTE — Op Note (Signed)
Robotic assisted Laparoscopic Transabdominal bilateral inguinal Hernia Repair with Mesh       Pre-operative Diagnosis: Bilateral inguinal Hernia   Post-operative Diagnosis: Same   Procedure: Robotic assisted Laparoscopic  repair of bilateral inguinal hernia(s)   Surgeon: Campbell Lerner, M.D., FACS   Anesthesia: GETA   Findings: Left indirect inguinal hernia, right direct inguinal hernia.          Procedure Details  The patient was seen again in the Holding Room. The benefits, complications, treatment options, and expected outcomes were discussed with the patient. The risks of bleeding, infection, recurrence of symptoms, failure to resolve symptoms, recurrence of hernia, ischemic orchitis, chronic pain syndrome or neuroma, were reviewed again. The likelihood of improving the patient's symptoms with return to their baseline status is good.  The patient and/or family concurred with the proposed plan, giving informed consent.  The patient was taken to Operating Room, identified  and the procedure verified as Laparoscopic Inguinal Hernia Repair. Laterality confirmed.  A Time Out was held and the above information confirmed.   Prior to the induction of general anesthesia, antibiotic prophylaxis was administered. VTE prophylaxis was in place. General endotracheal anesthesia was then administered and tolerated well. After the induction, the abdomen was prepped with Chloraprep and draped in the sterile fashion. The patient was positioned in the supine position.   After local infiltration of quarter percent Marcaine with epinephrine, stab incision was made left upper quadrant.  Just below the costal margin approximately midclavicular line the Veress needle is passed with sensation of the layers to penetrate the abdominal wall and into the peritoneum.  Saline drop test is confirmed peritoneal placement.  Insufflation is initiated with carbon dioxide to pressures of 15 mmHg. An 8.5 mm port is placed to  the left off of the midline, with blunt tipped trocar.  Pneumoperitoneum maintained w/o HD changes. No evidence of bowel injuries.  Two 8.5 mm ports placed under direct vision. The laparoscopy revealed large left sided indirect and a wid[place] right direct inguinal defect(s).   The robot was brought ot the table and docked in the standard fashion, no collision between arms was observed. Instruments were kept under direct view at all times. For bilateral inguinal hernia repair,  I developed a peritoneal flap. The sac(s) were reduced and dissected free from adjacent structures. We preserved the vas and the vessels, and visualized them to their convergence and beyond in the retroperitoneum. Once dissection was completed a large left sided BARD 3D Light mesh was placed and secured at three points with interrupted 0 Vicryl to the pubic tubercle and anteriorly. There was good coverage of the direct, indirect and femoral spaces. Second look revealed no complications or injuries.  Attention then was turned to the opposite side. The sac was also reduced and dissected free from adjacent structures. We preserved the vas and the vessels, in like manner with adequate posterior dissection. Once dissection was completed the same, but contralateral mesh was placed and secured in like manner with interrupted 0 Vicryl. There was good coverage of the direct, indirect and femoral spaces.  The flap was then closed with 2-0 V-lock suture.  Peritoneal closure without defects.  Once assuring that hemostasis was adequate, all needles/sponges removed, and the robot was undocked.  The ports were removed, the abdomen desulflated.  4-0 subcuticular Monocryl was used at all skin edges. Dermabond was placed.  Patient tolerated the procedure well. There were no complications. He was taken to the recovery room in stable condition.  Campbell Lerner, M.D., FACS 12/26/2020, 10:18 AM

## 2020-12-26 NOTE — Anesthesia Postprocedure Evaluation (Signed)
Anesthesia Post Note  Patient: Antonio Blackburn  Procedure(s) Performed: XI ROBOTIC ASSISTED INGUINAL HERNIA REPAIR WITH MESH, BILATERAL (Bilateral)  Patient location during evaluation: PACU Anesthesia Type: General Level of consciousness: awake and alert, awake and oriented Pain management: pain level controlled Vital Signs Assessment: post-procedure vital signs reviewed and stable Respiratory status: spontaneous breathing, nonlabored ventilation and respiratory function stable Cardiovascular status: blood pressure returned to baseline and stable Postop Assessment: no apparent nausea or vomiting Anesthetic complications: no Comments: Upper Eyelid puffiness noted, ? Etiology.  Asymptomatic.  Advised to observe, call if doesn't resolve.   No notable events documented.   Last Vitals:  Vitals:   12/26/20 1134 12/26/20 1202  BP: (!) 141/95 (!) 154/93  Pulse: 70 90  Resp:  16  Temp:  36.6 C  SpO2: 98% 100%    Last Pain:  Vitals:   12/26/20 1202  TempSrc: Temporal  PainSc: 7                  Manfred Arch

## 2020-12-26 NOTE — Transfer of Care (Signed)
Immediate Anesthesia Transfer of Care Note  Patient: Antonio Blackburn  Procedure(s) Performed: XI ROBOTIC ASSISTED INGUINAL HERNIA REPAIR WITH MESH, BILATERAL (Bilateral)  Patient Location: PACU  Anesthesia Type:General  Level of Consciousness: sedated  Airway & Oxygen Therapy: Patient connected to face mask  Post-op Assessment: Post -op Vital signs reviewed and stable  Post vital signs: stable  Last Vitals:  Vitals Value Taken Time  BP 150/93 12/26/20 1015  Temp 36.3 C 12/26/20 1014  Pulse 91 12/26/20 1019  Resp 19 12/26/20 1019  SpO2 97 % 12/26/20 1019  Vitals shown include unvalidated device data.  Last Pain:  Vitals:   12/26/20 1014  TempSrc:   PainSc: Asleep      Patients Stated Pain Goal: 0 (12/26/20 0731)  Complications: No notable events documented.

## 2020-12-31 ENCOUNTER — Telehealth: Payer: Self-pay | Admitting: Surgery

## 2020-12-31 NOTE — Telephone Encounter (Signed)
Pt called requesting a refill on his Hydrocodone & Ibuprofen to be sent to [facility]), plz.  He had bilateral inguinal hernia repair w/Dr. Claudine Mouton on 12/26/20, is out of the Hydrocodone & has about 6 IB's left.  He tried calling on Fri but the office had already closed.  Please advise by calling back.  Thank you

## 2021-01-01 NOTE — Telephone Encounter (Signed)
Spoke with patient-try tylenol 650 mg alternating with 800 mg Ibuprofen so that he is taking something every three hours for pain-can take over the counter medication. Patient verbalized understanding.

## 2021-01-03 ENCOUNTER — Telehealth: Payer: Self-pay | Admitting: *Deleted

## 2021-01-03 NOTE — Telephone Encounter (Signed)
Faxed FMLA to [phone]

## 2021-01-10 ENCOUNTER — Other Ambulatory Visit: Payer: Self-pay

## 2021-01-10 ENCOUNTER — Encounter: Payer: Self-pay | Admitting: Surgery

## 2021-01-10 ENCOUNTER — Ambulatory Visit (INDEPENDENT_AMBULATORY_CARE_PROVIDER_SITE_OTHER): Payer: 59 | Admitting: Surgery

## 2021-01-10 VITALS — BP 146/88 | HR 83 | Temp 97.3°F | Ht 71.0 in | Wt 143.0 lb

## 2021-01-10 DIAGNOSIS — Z8719 Personal history of other diseases of the digestive system: Secondary | ICD-10-CM

## 2021-01-10 DIAGNOSIS — Z9889 Other specified postprocedural states: Secondary | ICD-10-CM

## 2021-01-10 NOTE — Progress Notes (Signed)
[facility] POST-OP OFFICE VISIT  01/10/2021  HPI: LUQMAN PERRELLI is a 35 y.o. male 15 days s/p robotic repair of bilateral inguinal hernias.  He reports today his pain is significantly improved, some minimal soreness bilaterally.  Denies any difficulty with bowel movements.  Vital signs: BP (!) 146/88   Pulse 83   Temp (!) 97.3 F (36.3 C)   Ht 5\' 11"  (1.803 m)   Wt 143 lb (64.9 kg)   SpO2 99%   BMI 19.94 kg/m    Physical Exam: Constitutional: He appears well. Abdomen: Soft and benign.  No groin bulges whatsoever. Skin: Incisions clean dry and intact with Dermabond in place.  Assessment/Plan: This is a 35 y.o. male 15 days s/p robotic bilateral inguinal hernia repairs.  Patient Active Problem List   Diagnosis Date Noted   Bilateral recurrent inguinal hernia without obstruction or gangrene 12/18/2020    -We will continue to keep off work for the next 2 weeks.  Then may resume to full activity without restriction.  We will be glad to have him follow-up as needed.   02/18/2021 M.D., FACS 01/10/2021, 11:18 AM

## 2021-01-10 NOTE — Patient Instructions (Addendum)
You may return to work with out restrictions in 2 weeks.  Follow-up with our office as needed.  Please call and ask to speak with a nurse if you develop questions or concerns.   GENERAL POST-OPERATIVE PATIENT INSTRUCTIONS   WOUND CARE INSTRUCTIONS: Try to keep the wound dry and avoid ointments on the wound unless directed to do so.  If the wound becomes bright red and painful or starts to drain infected material that is not clear, please contact your physician immediately.  If the wound is mildly pink and has a thick firm ridge underneath it, this is normal, and is referred to as a healing ridge.  This will resolve over the next 4-6 weeks.  BATHING: You may shower if you have been informed of this by your surgeon. However, Please do not submerge in a tub, hot tub, or pool until incisions are completely sealed or have been told by your surgeon that you may do so.  DIET:  You may eat any foods that you can tolerate.  It is a good idea to eat a high fiber diet and take in plenty of fluids to prevent constipation.  If you do become constipated you may want to take a mild laxative or take ducolax tablets on a daily basis until your bowel habits are regular.  Constipation can be very uncomfortable, along with straining, after recent surgery.  ACTIVITY:  You may want to hug a pillow when coughing and sneezing to add additional support to the surgical area, if you had abdominal or chest surgery, which will decrease pain during these times.  You are encouraged to walk and engage in light activity for the next two weeks.  You should not lift more than 20 pounds for 4-6 weeks after surgery as it could put you at increased risk for complications.  Twenty pounds is roughly equivalent to a plastic bag of groceries. At that time- Listen to your body when lifting, if you have pain when lifting, stop and then try again in a few days. Soreness after doing exercises or activities of daily living is normal as you get  back in to your normal routine.  MEDICATIONS:  Try to take narcotic medications and anti-inflammatory medications, such as tylenol, ibuprofen, naprosyn, etc., with food.  This will minimize stomach upset from the medication.  Should you develop nausea and vomiting from the pain medication, or develop a rash, please discontinue the medication and contact your physician.  You should not drive, make important decisions, or operate machinery when taking narcotic pain medication.  SUNBLOCK Use sun block to incision area over the next year if this area will be exposed to sun. This helps decrease scarring and will allow you avoid a permanent darkened area over your incision.  QUESTIONS:  Please feel free to call our office if you have any questions, and we will be glad to assist you.

## 2021-01-23 ENCOUNTER — Telehealth: Payer: Self-pay | Admitting: Surgery

## 2021-01-23 NOTE — Telephone Encounter (Signed)
Return to work not faxed to [employer] [phone]. Return to work date of 01/28/21 with no restrictions.

## 2021-01-23 NOTE — Telephone Encounter (Signed)
Incoming call from patient.  He is asking if he can return to work starting Monday 01/28/21.  He had surgery inguinal hernia repair with Dr. Claudine Mouton on 12/26/20.  Patient currently has return to work for 01/30/21, but is asking if he can start with a full week starting 01/28/21.  He is currently without any issues and feeling much better from his surgery.  Please call him, thank you.
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
## 2021-05-10 ENCOUNTER — Ambulatory Visit
Admission: EM | Admit: 2021-05-10 | Discharge: 2021-05-10 | Disposition: A | Discharge status: Home / Self Care | Payer: 59 | Attending: Emergency Medicine | Admitting: Emergency Medicine

## 2021-05-10 ENCOUNTER — Other Ambulatory Visit: Payer: Self-pay

## 2021-05-10 DIAGNOSIS — R1031 Right lower quadrant pain: Secondary | ICD-10-CM | POA: Diagnosis not present

## 2021-05-10 MED ORDER — MELOXICAM 15 MG PO TABS: 15.0000 mg | ORAL_TABLET | Freq: Every day | ORAL | 0 refills | Status: DC

## 2021-05-10 NOTE — ED Triage Notes (Signed)
Pt here with C/O right side pain since Tuesday. Work involves a lot of pulling. Had hernia surgery in June, both sides, has mesh across both sides. Pt states that it is a sharp pain at times, no pain right now. Denies urine problems or blood in urine.

## 2021-05-10 NOTE — ED Provider Notes (Signed)
[facility]  ____________________________________________  Time seen: Approximately 8:46 AM  I have reviewed the triage vital signs and the nursing notes.   HISTORY  Chief Complaint Side Pain (right)    HPI Antonio Blackburn is a 35 y.o. male who presents the emergency department complaining of right abdominal wall pain.  Symptoms have been ongoing x3 days.  Patient has a history of hernia surgery to both inguinal regions.  Patient has mask in place.  No bulging.  No testicle pain.  No urinary changes.  Patient is had no fever, nausea, vomiting, diarrhea or constipation.  No history of GI issues other than GERD.       Past Medical History:  Diagnosis Date   Asthma    as a child   Clavicle fracture    right   GERD (gastroesophageal reflux disease)    occ no meds   History of methicillin resistant staphylococcus aureus (MRSA) 2016    Patient Active Problem List   Diagnosis Date Noted   Status post laparoscopic hernia repair 01/10/2021   Bilateral recurrent inguinal hernia without obstruction or gangrene 12/18/2020    Past Surgical History:  Procedure Laterality Date   NO PAST SURGERIES     XI ROBOTIC ASSISTED INGUINAL HERNIA REPAIR WITH MESH Bilateral 12/26/2020   Procedure: XI ROBOTIC ASSISTED INGUINAL HERNIA REPAIR WITH MESH, BILATERAL;  Surgeon: Ronny Bacon, MD;  Location: [facility] ORS;  Service: General;  Laterality: Bilateral;    Prior to Admission medications   Medication Sig Start Date End Date Taking? Authorizing Provider  acetaminophen (TYLENOL) 325 MG tablet Take 650 mg by mouth every 6 (six) hours as needed for moderate pain.   Yes [provider]  ibuprofen (ADVIL) 800 MG tablet Take 1 tablet (800 mg total) by mouth every 8 (eight) hours as needed. 12/26/20  Yes Ronny Bacon, MD  meloxicam (MOBIC) 15 MG tablet Take 1 tablet (15 mg total) by mouth daily. 05/10/21  Yes Gavynn Duvall, Charline Bills, PA-C  Multiple Vitamin (MULTIVITAMIN WITH MINERALS)  TABS tablet Take 1 tablet by mouth daily.    [provider]    Allergies Patient has no known allergies.  History reviewed. No pertinent family history.  Social History Social History   Tobacco Use   Smoking status: Every Day    Packs/day: 0.50    Years: 12.00    Pack years: 6.00    Types: Cigarettes   Smokeless tobacco: Never  Vaping Use   Vaping Use: Never used  Substance Use Topics   Alcohol use: Yes    Alcohol/week: 4.0 standard drinks    Types: 4 Cans of beer per week    Comment: occ   Drug use: Not Currently    Types: Marijuana     Review of Systems  Constitutional: No fever/chills Eyes: No visual changes. No discharge ENT: No upper respiratory complaints. Cardiovascular: no chest pain. Respiratory: no cough. No SOB. Gastrointestinal: Right-sided abdominal wall pain.  No nausea, no vomiting.  No diarrhea.  No constipation. Genitourinary: Negative for dysuria. No hematuria Musculoskeletal: Negative for musculoskeletal pain. Skin: Negative for rash, abrasions, lacerations, ecchymosis. Neurological: Negative for headaches, focal weakness or numbness.  10 System ROS otherwise negative.  ____________________________________________   PHYSICAL EXAM:  VITAL SIGNS: ED Triage Vitals  Enc Vitals Group     BP 05/10/21 0828 132/85     Pulse Rate 05/10/21 0828 75     Resp 05/10/21 0828 18     Temp 05/10/21 0828 98.7 F (37.1  C)     Temp Source 05/10/21 0828 Oral     SpO2 05/10/21 0828 96 %     Weight 05/10/21 0827 140 lb (63.5 kg)     Height 05/10/21 0827 5\' 11"  (1.803 m)     Head Circumference --      Peak Flow --      Pain Score 05/10/21 0827 0     Pain Loc --      Pain Edu? --      Excl. in [place]? --      Constitutional: Alert and oriented. Well appearing and in no acute distress. Eyes: Conjunctivae are normal. PERRL. EOMI. Head: Atraumatic. ENT:      Ears:       Nose: No congestion/rhinnorhea.      Mouth/Throat: Mucous membranes are  moist.  Neck: No stridor.    Cardiovascular: Normal rate, regular rhythm. Normal S1 and S2.  Good peripheral circulation. Respiratory: Normal respiratory effort without tachypnea or retractions. Lungs CTAB. Good air entry to the bases with no decreased or absent breath sounds. Gastrointestinal: Bowel sounds 4 quadrants. Soft and nontender to palpation over the abdomen.  Over the suprapubic/superior inguinal region patient is slightly tender.  No palpable abnormality or bulge.. No guarding or rigidity. No palpable masses. No distention. No CVA tenderness. Musculoskeletal: Full range of motion to all extremities. No gross deformities appreciated. Neurologic:  Normal speech and language. No gross focal neurologic deficits are appreciated.  Skin:  Skin is warm, dry and intact. No rash noted. Psychiatric: Mood and affect are normal. Speech and behavior are normal. Patient exhibits appropriate insight and judgement.   ____________________________________________   LABS (all labs ordered are listed, but only abnormal results are displayed)  Labs Reviewed - No data to display ____________________________________________  EKG   ____________________________________________  RADIOLOGY   No results found.  ____________________________________________    PROCEDURES  Procedure(s) performed:    Procedures    Medications - No data to display   ____________________________________________   INITIAL IMPRESSION / ASSESSMENT AND PLAN / ED COURSE  Pertinent labs & imaging results that were available during my care of the patient were reviewed by me and considered in my medical decision making (see chart for details).  Review of the [place] CSRS was performed in accordance of the [place] prior to dispensing any controlled drugs.           Patient's diagnosis is consistent with right lower abdominal pain.  Patient presented to the emergency department complaining of pain in the right  lower abdomen/abdominal wall.  Patient does have a history of hernia with mesh placed.  Patient has had no complications since surgery.  He does a lot of heavy lifting at work.  No fevers, chills, anorexia.  No diarrhea or constipation.  No emesis.  I discussed that in the urgent care I cannot perform labs, CT scan and recommended follow-up in the emergency department if symptoms were worsening.  Patient would prefer anti-inflammatory as he feels like it is his abdominal wall.  Feel this is reasonable at this time, as I currently have a low suspicion for appendicitis, but have given strict precautions to go to the ED for any fevers, chills, worsening pain.  Patient is agreeable with this plan. Patient is given ED precautions to return to the ED for any worsening or new symptoms.     ____________________________________________  FINAL CLINICAL IMPRESSION(S) / DIAGNOSES  Final diagnoses:  Right lower quadrant abdominal pain  NEW MEDICATIONS STARTED DURING THIS VISIT:  ED Discharge Orders          Ordered    meloxicam (MOBIC) 15 MG tablet  Daily        05/10/21 0849                This chart was dictated using voice recognition software/Dragon. Despite best efforts to proofread, errors can occur which can change the meaning. Any change was purely unintentional.    Racheal Patches, PA-C 05/10/21 [phone]

## 2021-09-20 ENCOUNTER — Encounter: Payer: Self-pay | Admitting: Family Medicine

## 2021-09-20 ENCOUNTER — Ambulatory Visit: Payer: Self-pay | Admitting: Family Medicine

## 2021-09-20 DIAGNOSIS — Z113 Encounter for screening for infections with a predominantly sexual mode of transmission: Secondary | ICD-10-CM

## 2021-09-20 DIAGNOSIS — Z202 Contact with and (suspected) exposure to infections with a predominantly sexual mode of transmission: Secondary | ICD-10-CM

## 2021-09-20 LAB — HEPATITIS B SURFACE ANTIGEN: Hepatitis B Surface Ag: NONREACTIVE

## 2021-09-20 LAB — HM HIV SCREENING LAB: HM HIV Screening: NEGATIVE

## 2021-09-20 LAB — GRAM STAIN

## 2021-09-20 LAB — HM HEPATITIS C SCREENING LAB: HM Hepatitis Screen: NEGATIVE

## 2021-09-20 MED ORDER — METRONIDAZOLE 500 MG PO TABS: 500.0000 mg | ORAL_TABLET | Freq: Two times a day (BID) | ORAL | 0 refills | Status: AC

## 2021-09-20 NOTE — Progress Notes (Signed)
Pt here for STD screening and as a contact to Tr[facility].  Gram stain results reviewed.  Medication dispensed per Provider orders.  Condoms given.  Windle Guard, RN ? ?

## 2021-09-20 NOTE — Progress Notes (Signed)
[facility] Department ?STI clinic/screening visit ? ?Subjective:  ?Antonio Blackburn is a 36 y.o. male being seen today for an STI screening visit. The patient reports they do not have symptoms.   ? ?Patient has the following medical conditions:   ?Patient Active Problem List  ? Diagnosis Date Noted  ? Status post laparoscopic hernia repair 01/10/2021  ? Bilateral recurrent inguinal hernia without obstruction or gangrene 12/18/2020  ? ? ? ?Chief Complaint  ?Patient presents with  ? SEXUALLY TRANSMITTED DISEASE  ?  Screening and contact to Tr[facility]  ? ? ?HPI ? ?Patient reports here for screening and contact to Trich  ? ?Does the patient or their partner desires a pregnancy in the next year? No ? ?Screening for MPX risk: ?Does the patient have an unexplained rash? No ?Is the patient MSM? No ?Does the patient endorse multiple sex partners or anonymous sex partners? No ?Did the patient have close or sexual contact with a person diagnosed with MPX? No ?Has the patient traveled outside the [place] where MPX is endemic? No ?Is there a high clinical suspicion for MPX-- evidenced by one of the following No ? -Unlikely to be chickenpox ? -Lymphadenopathy ? -Rash that present in same phase of evolution on any given body part ? ? ?See flowsheet for further details and programmatic requirements.  ? ? ?The following portions of the patient's history were reviewed and updated as appropriate: allergies, current medications, past medical history, past social history, past surgical history and problem list. ? ?Objective:  ?There were no vitals filed for this visit. ? ?Physical Exam ?Constitutional:   ?   Appearance: Normal appearance.  ?HENT:  ?   Head: Normocephalic.  ?   Mouth/Throat:  ?   Mouth: Mucous membranes are moist.  ?   Pharynx: Oropharynx is clear. No oropharyngeal exudate.  ?Pulmonary:  ?   Effort: Pulmonary effort is normal.  ?Genitourinary: ?   Penis: Normal.   ?   Testes: Normal.  ?   Comments: No lice, nits, or  pest, no lesions or odor discharge.  Denies pain or tenderness with paplation of testicles.  No lesions, ulcers or masses present.    ?Musculoskeletal:  ?   Cervical back: Normal range of motion.  ?Lymphadenopathy:  ?   Cervical: No cervical adenopathy.  ?Skin: ?   General: Skin is warm and dry.  ?   Findings: No bruising, erythema, lesion or rash.  ?Neurological:  ?   General: No focal deficit present.  ?   Mental Status: He is alert and oriented to person, place, and time.  ?Psychiatric:     ?   Mood and Affect: Mood normal.     ?   Behavior: Behavior normal.  ? ? ? ? ?Assessment and Plan:  ?Antonio Blackburn is a 36 y.o. male presenting to the [facility] Department for STI screening ? ?1. Screening examination for venereal disease ?Patient does not have STI symptoms ?Patient accepted all screenings including  gram stain, oral, urethral CT/GC and bloodwork for HIV/RPR.  ?Patient meets criteria for HepB screening? Yes. Ordered? Yes ?Patient meets criteria for HepC screening? Yes. Ordered? Yes ?Recommended condom use with all sex ?Discussed importance of condom use for STI prevent ? ?Treat gram stain per standing order ?Discussed time line for State [facility] results and that patient will be called with positive results and encouraged patient to call if he had not heard in 2 weeks ?Recommended returning for continued or worsening  symptoms.  ?- Gonococcus culture ?- Gram stain ?- HBV Antigen/Antibody [facility] ?- HIV/HCV [facility] ?- Syphilis Serology, [place] [facility] ?- Gonococcus culture ? ?2. Exposure to trichomonas ? ?- metroNIDAZOLE (FLAGYL) 500 MG tablet; Take 1 tablet (500 mg total) by mouth 2 (two) times daily for 7 days.  Dispense: 14 tablet; Refill: 0 ? ? ? ? ?No follow-ups on file. ? ?No future appointments. ? ?Wendi Snipes, FNP ?

## 2021-09-25 LAB — GONOCOCCUS CULTURE

## 2022-01-01 ENCOUNTER — Ambulatory Visit: Payer: Self-pay

## 2022-01-24 ENCOUNTER — Encounter: Payer: Self-pay | Admitting: Emergency Medicine

## 2022-01-24 ENCOUNTER — Ambulatory Visit
Admission: EM | Admit: 2022-01-24 | Discharge: 2022-01-24 | Disposition: A | Discharge status: Home / Self Care | Payer: BC Managed Care – PPO | Attending: Emergency Medicine | Admitting: Emergency Medicine

## 2022-01-24 DIAGNOSIS — S46812A Strain of other muscles, fascia and tendons at shoulder and upper arm level, left arm, initial encounter: Secondary | ICD-10-CM

## 2022-01-24 DIAGNOSIS — Z202 Contact with and (suspected) exposure to infections with a predominantly sexual mode of transmission: Secondary | ICD-10-CM | POA: Diagnosis not present

## 2022-01-24 DIAGNOSIS — R300 Dysuria: Secondary | ICD-10-CM | POA: Insufficient documentation

## 2022-01-24 LAB — URINALYSIS, ROUTINE W REFLEX MICROSCOPIC
Bilirubin Urine: NEGATIVE
Glucose, UA: NEGATIVE mg/dL
Hgb urine dipstick: NEGATIVE
Leukocytes,Ua: NEGATIVE
Nitrite: NEGATIVE
Protein, ur: NEGATIVE mg/dL
Specific Gravity, Urine: >1.03 — ABNORMAL HIGH (ref 1.005–1.030)
pH: 5.5 (ref 5.0–8.0)

## 2022-01-24 LAB — HIV ANTIBODY (ROUTINE TESTING W REFLEX): HIV Screen 4th Generation wRfx: NONREACTIVE

## 2022-01-24 MED ORDER — KETOROLAC TROMETHAMINE 60 MG/2ML IM SOLN
30.0000 mg | Freq: Once | INTRAMUSCULAR | Status: AC
  Administered 2022-01-24: 30 mg via INTRAMUSCULAR

## 2022-01-24 MED ORDER — DOXYCYCLINE HYCLATE 100 MG PO CAPS: 100.0000 mg | ORAL_CAPSULE | Freq: Two times a day (BID) | ORAL | 0 refills | Status: DC

## 2022-01-24 MED ORDER — CYCLOBENZAPRINE HCL 10 MG PO TABS: 10.0000 mg | ORAL_TABLET | Freq: Two times a day (BID) | ORAL | 0 refills | Status: DC | PRN

## 2022-01-24 MED ORDER — MELOXICAM 15 MG PO TABS: 15.0000 mg | ORAL_TABLET | Freq: Every day | ORAL | 0 refills | Status: DC

## 2022-01-24 NOTE — ED Provider Notes (Signed)
[facility]    CSN: [id number] Arrival date & time: 01/24/22  1124      History   Chief Complaint Chief Complaint  Patient presents with   Exposure to STD   Back Pain    HPI Antonio Blackburn is a 36 y.o. male.   Patient presents with dysuria, urinary frequency and urgency for 7 days.  Has not attempted treatment of symptoms.  Endorses partner tested positive for chlamydia, requesting treatment.  Denies penile discharge, penile or testicle swelling, new rash or lesions, abdominal pain, flank pain.  Endorses that he tested positive for trichomoniasis recently but lost prescription for medication.  Patient concerned with left shoulder blade pain for 2 to 3 days.  Pain has been constant, was coughing today when he heard a popping sound which caused pain to worsen.  Pain can be felt with twisting and turning.  Pain does not radiate.  Denies numbness or tingling, prior injury or trauma, urinary or bowel incontinence.  Has attempted use of over-the-counter ibuprofen and Tylenol which has been ineffective.    Past Medical History:  Diagnosis Date   Asthma    as a child   Clavicle fracture    right   GERD (gastroesophageal reflux disease)    occ no meds   History of methicillin resistant staphylococcus aureus (MRSA) 2016    Patient Active Problem List   Diagnosis Date Noted   Status post laparoscopic hernia repair 01/10/2021   Bilateral recurrent inguinal hernia without obstruction or gangrene 12/18/2020    Past Surgical History:  Procedure Laterality Date   HERNIA REPAIR     NO PAST SURGERIES     XI ROBOTIC ASSISTED INGUINAL HERNIA REPAIR WITH MESH Bilateral 12/26/2020   Procedure: XI ROBOTIC ASSISTED INGUINAL HERNIA REPAIR WITH MESH, BILATERAL;  Surgeon: Campbell Lerner, MD;  Location: [facility] ORS;  Service: General;  Laterality: Bilateral;       Home Medications    Prior to Admission medications   Medication Sig Start Date End Date Taking? Authorizing  Provider  Multiple Vitamin (MULTIVITAMIN WITH MINERALS) TABS tablet Take 1 tablet by mouth daily.   Yes [provider]  acetaminophen (TYLENOL) 325 MG tablet Take 650 mg by mouth every 6 (six) hours as needed for moderate pain. Patient not taking: Reported on 09/20/2021    [provider]  ibuprofen (ADVIL) 800 MG tablet Take 1 tablet (800 mg total) by mouth every 8 (eight) hours as needed. Patient not taking: Reported on 09/20/2021 12/26/20   Campbell Lerner, MD  meloxicam (MOBIC) 15 MG tablet Take 1 tablet (15 mg total) by mouth daily. Patient not taking: Reported on 09/20/2021 05/10/21   Cuthriell, Delorise Royals, PA-C    Family History History reviewed. No pertinent family history.  Social History Social History   Tobacco Use   Smoking status: Every Day    Packs/day: 0.50    Years: 12.00    Total pack years: 6.00    Types: Cigarettes   Smokeless tobacco: Never  Vaping Use   Vaping Use: Never used  Substance Use Topics   Alcohol use: Yes    Alcohol/week: 4.0 standard drinks of alcohol    Types: 4 Cans of beer per week    Comment: occ   Drug use: Yes    Types: Marijuana    Comment: daily     Allergies   Patient has no known allergies.   Review of Systems Review of Systems Defer to HPI    Physical  Exam Triage Vital Signs ED Triage Vitals  Enc Vitals Group     BP 01/24/22 1152 (!) 142/91     Pulse Rate 01/24/22 1152 64     Resp 01/24/22 1152 15     Temp 01/24/22 1152 99 F (37.2 C)     Temp Source 01/24/22 1152 Oral     SpO2 01/24/22 1152 99 %     Weight 01/24/22 1151 145 lb (65.8 kg)     Height 01/24/22 1151 5\' 11"  (1.803 m)     Head Circumference --      Peak Flow --      Pain Score 01/24/22 1151 4     Pain Loc --      Pain Edu? --      Excl. in GC? --    No data found.  Updated Vital Signs BP (!) 142/91 (BP Location: Left Arm)   Pulse 64   Temp 99 F (37.2 C) (Oral)   Resp 15   Ht 5\' 11"  (1.803 m)   Wt 145 lb (65.8 kg)   SpO2  99%   BMI 20.22 kg/m   Visual Acuity Right Eye Distance:   Left Eye Distance:   Bilateral Distance:    Right Eye Near:   Left Eye Near:    Bilateral Near:     Physical Exam Constitutional:      Appearance: Normal appearance.  HENT:     Head: Normocephalic.  Eyes:     Extraocular Movements: Extraocular movements intact.  Pulmonary:     Effort: Pulmonary effort is normal.  Genitourinary:    Comments: Deferred Musculoskeletal:     Comments: Unable to reproduce tenderness on exam, no ecchymosis, swelling or deformity, range of motion is intact, strength is a 5 out of 5, negative straight leg test, able to sit upright without complication, range of motion of both shoulders is intact, 2+ brachial pulses, strength is a 5 out of 5  Skin:    General: Skin is warm and dry.  Neurological:     Mental Status: He is alert and oriented to person, place, and time. Mental status is at baseline.  Psychiatric:        Mood and Affect: Mood normal.        Behavior: Behavior normal.      UC Treatments / Results  Labs (all labs ordered are listed, but only abnormal results are displayed) Labs Reviewed  URINALYSIS, ROUTINE W REFLEX MICROSCOPIC  CYTOLOGY, (ORAL, ANAL, URETHRAL) ANCILLARY ONLY    EKG   Radiology No results found.  Procedures Procedures (including critical care time)  Medications Ordered in UC Medications - No data to display  Initial Impression / Assessment and Plan / UC Course  I have reviewed the triage vital signs and the nursing notes.  Pertinent labs & imaging results that were available during my care of the patient were reviewed by me and considered in my medical decision making (see chart for details).  Dysuria, exposure to STD Strain of left trapezius muscle, initial encounter  STI labs are pending, will prophylactically treat for chlamydia and due to known exposure, doxycycline 7-day course prescribed and recommended abstinence for 7 days  posttreatment, until lab results and all symptoms have resolved, advised condom use during all sexual encounters moving forward, per chart review last STI testing in March 2023 therefore will defer trichomoniasis treatment until labs have resulted, discussed with patient  Etiology of shoulder pain is most likely muscular, will defer imaging due  to low suspicion of bone involvement due to lack of injury, discussed with patient, Toradol injection given in office and meloxicam and Flexeril prescribed for outpatient use, recommended RICE, heat, daily stretching, massage and activity as tolerated, given walking referral to orthopedics if symptoms persist or worsen  Final Clinical Impressions(s) / UC Diagnoses   Final diagnoses:  None   Discharge Instructions   None    ED Prescriptions   None    PDMP not reviewed this encounter.   Valinda Hoar, NP 01/24/22 1225

## 2022-01-24 NOTE — ED Triage Notes (Signed)
Patient states that his partner tested positive for Chlamydia and is here for treatment.  Patient also states that he lost his prescription trichomonas and needs treat for that.  Patient also states that he popped his upper/mid back about 2-3 days ago and has been having ongoing pain there.

## 2022-01-24 NOTE — Discharge Instructions (Signed)
For your back Your pain is most likely caused by irritation to the muscles. -You have been given an injection of Toradol in office today to help decrease inflammation that occurs with injury which in turn will help with your pain - Starting tomorrow take meloxicam every morning with food for 5 days to continue the above process then you may use as needed - You may use muscle relaxer twice daily for additional comfort, be mindful this can make you drowsy -You may use heating pad in 15 minute intervals as needed for additional comfort, or you may find comfort in using ice in 10-15 minutes over affected area -Begin stretching affected area daily for 10 minutes as tolerated to further loosen muscles  -When lying down place pillow underneath and between knees for support -If pain persist after recommended treatment or reoccurs if may be beneficial to follow up with orthopedic specialist for evaluation, this doctor specializes in the bones and can manage your symptoms long-term with options such as but not limited to imaging, medications or physical therapy   For you burning sensation with urination  -Today you are being treated prophylactically for chlamydia -Take doxycycline every morning and every evening for 7 days, refrain from having sex and additional 7 days posttreatment -Labs pending ,you will be contacted if positive for any sti and treatment will be sent to the pharmacy, you will have to return to the clinic if positive for gonorrhea to receive treatment  -Please refrain from having sex until labs results, if positive please refrain from having sex until treatment complete and symptoms resolve  -If positive for HIV, Syphilis, Chlamydia  gonorrhea or trichomoniasis please notify partner or partners so they may tested as well -Moving forward, it is recommended you use some form of protection against the transmission of sti infections  such as condoms or dental dams with each sexual encounter

## 2022-01-25 LAB — RPR: RPR Ser Ql: NONREACTIVE

## 2022-01-27 LAB — CYTOLOGY, (ORAL, ANAL, URETHRAL) ANCILLARY ONLY
Chlamydia: POSITIVE — AB
Comment: NEGATIVE
Comment: NEGATIVE
Comment: NORMAL
Neisseria Gonorrhea: NEGATIVE
Trichomonas: POSITIVE — AB

## 2022-01-28 ENCOUNTER — Telehealth (HOSPITAL_COMMUNITY): Payer: Self-pay | Admitting: Emergency Medicine

## 2022-01-28 MED ORDER — METRONIDAZOLE 500 MG PO TABS
2000.0000 mg | ORAL_TABLET | Freq: Once | ORAL | 0 refills | Status: AC
Start: 2022-01-28 — End: 2022-01-28

## 2022-02-26 ENCOUNTER — Encounter: Payer: Self-pay | Admitting: Emergency Medicine

## 2022-02-26 ENCOUNTER — Ambulatory Visit
Admission: EM | Admit: 2022-02-26 | Discharge: 2022-02-26 | Disposition: A | Discharge status: Home / Self Care | Payer: BC Managed Care – PPO | Attending: Family Medicine | Admitting: Family Medicine

## 2022-02-26 DIAGNOSIS — R030 Elevated blood-pressure reading, without diagnosis of hypertension: Secondary | ICD-10-CM | POA: Diagnosis present

## 2022-02-26 DIAGNOSIS — Z113 Encounter for screening for infections with a predominantly sexual mode of transmission: Secondary | ICD-10-CM | POA: Diagnosis present

## 2022-02-26 NOTE — ED Triage Notes (Signed)
Pt presents for STD testing denies any symptoms.

## 2022-02-26 NOTE — ED Provider Notes (Signed)
[facility]    CSN: [id number] Arrival date & time: 02/26/22  1021      History   Chief Complaint Chief Complaint  Patient presents with   STD Testing    HPI  HPI  Antonio Blackburn is a 36 y.o. male.   Pt here for STD screening.  He was treated for chlamydia and trichomonas about a month ago.  He completed his treatment.  Keevin does not use condoms regularly. Reports no symptoms in himself or in partner.  Denies dysuria, urinary frequency, urinary urgency, hematuria, fever, urethral discharge, rash or sores, mouth ulcers, penile discharge, testicular pain, abdominal pain, arthralgias and headache.       Past Medical History:  Diagnosis Date   Asthma    as a child   Clavicle fracture    right   GERD (gastroesophageal reflux disease)    occ no meds   History of methicillin resistant staphylococcus aureus (MRSA) 2016    Patient Active Problem List   Diagnosis Date Noted   Status post laparoscopic hernia repair 01/10/2021   Bilateral recurrent inguinal hernia without obstruction or gangrene 12/18/2020    Past Surgical History:  Procedure Laterality Date   HERNIA REPAIR     NO PAST SURGERIES     XI ROBOTIC ASSISTED INGUINAL HERNIA REPAIR WITH MESH Bilateral 12/26/2020   Procedure: XI ROBOTIC ASSISTED INGUINAL HERNIA REPAIR WITH MESH, BILATERAL;  Surgeon: Ronny Bacon, MD;  Location: [facility] ORS;  Service: General;  Laterality: Bilateral;       Home Medications    Prior to Admission medications   Medication Sig Start Date End Date Taking? Authorizing Provider  acetaminophen (TYLENOL) 325 MG tablet Take 650 mg by mouth every 6 (six) hours as needed for moderate pain. Patient not taking: Reported on 09/20/2021    [provider]  cyclobenzaprine (FLEXERIL) 10 MG tablet Take 1 tablet (10 mg total) by mouth 2 (two) times daily as needed for muscle spasms. 01/24/22   White, Leitha Schuller, NP  doxycycline (VIBRAMYCIN) 100 MG capsule Take 1 capsule  (100 mg total) by mouth 2 (two) times daily. 01/24/22   White, Leitha Schuller, NP  ibuprofen (ADVIL) 800 MG tablet Take 1 tablet (800 mg total) by mouth every 8 (eight) hours as needed. Patient not taking: Reported on 09/20/2021 12/26/20   Ronny Bacon, MD  meloxicam (MOBIC) 15 MG tablet Take 1 tablet (15 mg total) by mouth daily. 01/24/22   Hans Eden, NP  Multiple Vitamin (MULTIVITAMIN WITH MINERALS) TABS tablet Take 1 tablet by mouth daily.    [provider]    Family History No family history on file.  Social History Social History   Tobacco Use   Smoking status: Every Day    Packs/day: 0.50    Years: 12.00    Total pack years: 6.00    Types: Cigarettes   Smokeless tobacco: Never  Vaping Use   Vaping Use: Never used  Substance Use Topics   Alcohol use: Yes    Alcohol/week: 4.0 standard drinks of alcohol    Types: 4 Cans of beer per week    Comment: occ   Drug use: Yes    Types: Marijuana    Comment: daily     Allergies   Patient has no known allergies.   Review of Systems Review of Systems: negative unless otherwise stated in HPI.      Physical Exam Triage Vital Signs ED Triage Vitals  Enc Vitals Group  BP      Pulse      Resp      Temp      Temp src      SpO2      Weight      Height      Head Circumference      Peak Flow      Pain Score      Pain Loc      Pain Edu?      Excl. in GC?    No data found.  Updated Vital Signs BP (!) 152/111 (BP Location: Right Arm)   Pulse 62   Temp 98.7 F (37.1 C) (Oral)   Resp 16   SpO2 100%   Visual Acuity Right Eye Distance:   Left Eye Distance:   Bilateral Distance:    Right Eye Near:   Left Eye Near:    Bilateral Near:     Physical Exam GEN: well appearing male in no acute distress  CVS: well perfused, regular rate and rhythm RESP: speaking in full sentences without pause, no respiratory distress, clear to auscultation bilaterally GU: deferred, patient performed self swab   SKIN: Warm and dry   UC Treatments / Results  Labs (all labs ordered are listed, but only abnormal results are displayed) Labs Reviewed  CYTOLOGY, (ORAL, ANAL, URETHRAL) ANCILLARY ONLY    EKG   Radiology No results found.  Procedures Procedures (including critical care time)  Medications Ordered in UC Medications - No data to display  Initial Impression / Assessment and Plan / UC Course  I have reviewed the triage vital signs and the nursing notes.  Pertinent labs & imaging results that were available during my care of the patient were reviewed by me and considered in my medical decision making (see chart for details).       STI screening  High risk heterosexual behavior Patient is a 36 year old male who was recently treated for trichomonas and chlamydia.  He returns today for follow-up testing.  GC and chlamydia DNA  probe sent to lab. HIV and RPR were negative last month. - F/U if symptoms not improving or getting worse.  - F/U with PCP as needed.   Elevated blood pressure Patient with BP 152/111.  On chart review, blood pressures were intermittently elevated over the past year.  He has questions of how to lower his blood pressure.  States that his family often cooks salty food.  Tries not to eat out often.  Discussed healthy lifestyle changes.  Handout provided.  Defer starting blood pressure medications at this time.  He is to follow-up with his primary care doctor.  Understanding voiced.      Final Clinical Impressions(s) / UC Diagnoses   Final diagnoses:  Elevated BP without diagnosis of hypertension  Encounter for screening examination for sexually transmitted disease     Discharge Instructions      Your blood pressure was elevated today.  See handout on healthy eating.  Implement some of the lifestyle changes that we discussed.  Follow-up with your primary care doctor.            ED Prescriptions   None    PDMP not reviewed this  encounter.   Katha Cabal, DO 02/26/22 1235

## 2022-02-26 NOTE — Discharge Instructions (Signed)
Your blood pressure was elevated today.  See handout on healthy eating.  Implement some of the lifestyle changes that we discussed.  Follow-up with your primary care doctor.

## 2022-02-27 LAB — CYTOLOGY, (ORAL, ANAL, URETHRAL) ANCILLARY ONLY
Chlamydia: NEGATIVE
Comment: NEGATIVE
Comment: NEGATIVE
Comment: NORMAL
Neisseria Gonorrhea: NEGATIVE
Trichomonas: POSITIVE — AB

## 2022-02-28 ENCOUNTER — Telehealth (HOSPITAL_COMMUNITY): Payer: Self-pay

## 2022-02-28 MED ORDER — METRONIDAZOLE 500 MG PO TABS: 2000.0000 mg | ORAL_TABLET | Freq: Once | ORAL | 0 refills | Status: AC

## 2023-04-16 ENCOUNTER — Telehealth: Payer: BC Managed Care – PPO | Admitting: Physician Assistant

## 2023-04-16 DIAGNOSIS — R6889 Other general symptoms and signs: Secondary | ICD-10-CM

## 2023-04-16 DIAGNOSIS — M940 Chondrocostal junction syndrome [Tietze]: Secondary | ICD-10-CM

## 2023-04-16 MED ORDER — MELOXICAM 15 MG PO TABS: 15.0000 mg | ORAL_TABLET | Freq: Every day | ORAL | 0 refills | Status: DC

## 2023-04-16 NOTE — Progress Notes (Signed)
Virtual Visit Consent   Antonio Blackburn, you are scheduled for a virtual visit with a [facility] provider today. Just as with appointments in the office, your consent must be obtained to participate. Your consent will be active for this visit and any virtual visit you may have with one of our providers in the next 365 days. If you have a MyChart account, a copy of this consent can be sent to you electronically.  As this is a virtual visit, video technology does not allow for your provider to perform a traditional examination. This may limit your provider's ability to fully assess your condition. If your provider identifies any concerns that need to be evaluated in person or the need to arrange testing (such as labs, EKG, etc.), we will make arrangements to do so. Although advances in technology are sophisticated, we cannot ensure that it will always work on either your end or our end. If the connection with a video visit is poor, the visit may have to be switched to a telephone visit. With either a video or telephone visit, we are not always able to ensure that we have a secure connection.  By engaging in this virtual visit, you consent to the provision of healthcare and authorize for your insurance to be billed (if applicable) for the services provided during this visit. Depending on your insurance coverage, you may receive a charge related to this service.  I need to obtain your verbal consent now. Are you willing to proceed with your visit today? [name] has provided verbal consent on 04/16/2023 for a virtual visit (video or telephone). Antonio Blackburn, [place]  Date: 04/16/2023 10:44 AM  Virtual Visit via Video Note   I, Antonio Blackburn, connected with  Antonio Blackburn  ([id number], 1985-11-10) on 04/16/23 at 10:30 AM EST by a video-enabled telemedicine application and verified that I am speaking with the correct person using two identifiers.  Location: Patient: Virtual Visit Location  Patient: Home Provider: Virtual Visit Location Provider: Home Office   I discussed the limitations of evaluation and management by telemedicine and the availability of in person appointments. The patient expressed understanding and agreed to proceed.    History of Present Illness: Antonio Blackburn is a 37 y.o. who identifies as a male who was assigned male at birth, and is being seen today for several days of feeling under the weather. Notes symptoms starting Monday with some mild back pain of mid back along with fevers, chills, nasal congestion. Left work early and tried to rest. Tuesday he woke up feeling well overall but later in the morning starting noting a reoccurrence of chills, body aches, headache. Took some Ibuprofen and Tylenol with fever breaking that evening. Yesterday (Wednesday), with some pain in R chest with coughing or deep breathe that moved to being across the chest and back. Denies SOB. Pain non exertional. Took two COVID tests since symptom onset as a precaution that were negative. This morning with some residual soreness. No recurrence of fever, chills, sweats. Headache is mostly resolved. Cough is resolved.   HPI: HPI  Problems:  Patient Active Problem List   Diagnosis Date Noted   Status post laparoscopic hernia repair 01/10/2021   Bilateral recurrent inguinal hernia without obstruction or gangrene 12/18/2020    Allergies: No Known Allergies Medications:  Current Outpatient Medications:    meloxicam (MOBIC) 15 MG tablet, Take 1 tablet (15 mg total) by mouth daily., Disp: 15 tablet, Rfl: 0  Observations/Objective:  Patient is well-developed, well-nourished in no acute distress.  Resting comfortably at home.  Head is normocephalic, atraumatic.  No labored breathing. Speech is clear and coherent with logical content.  Patient is alert and oriented at baseline.  Assessment and Plan: 1. Flu-like symptoms  2. Costochondritis - meloxicam (MOBIC) 15 MG tablet; Take 1  tablet (15 mg total) by mouth daily.  Dispense: 15 tablet; Refill: 0  Outside of antiviral window. Most symptoms are resolved but residual chest wall and back soreness, suspect costochondritis. Supportive measures and OTC medications reviewed. Meloxicam per orders. Follow-up in person if not resolving or any new/recurring symptoms.  Follow Up Instructions: I discussed the assessment and treatment plan with the patient. The patient was provided an opportunity to ask questions and all were answered. The patient agreed with the plan and demonstrated an understanding of the instructions.  A copy of instructions were sent to the patient via MyChart unless otherwise noted below.   The patient was advised to call back or seek an in-person evaluation if the symptoms worsen or if the condition fails to improve as anticipated.    Antonio Climes, PA-C

## 2023-04-16 NOTE — Patient Instructions (Signed)
  [name], thank you for joining [name], PA-C for today's virtual visit.  While this provider is not your primary care provider (PCP), if your PCP is located in our provider database this encounter information will be shared with them immediately following your visit.   A [facility] MyChart account gives you access to today's visit and all your visits, tests, and labs performed at [facility] " click here if you don't have a [facility] MyChart account or go to [email or web address]  Consent: (Patient) [name] provided verbal consent for this virtual visit at the beginning of the encounter.  Current Medications:  Current Outpatient Medications:    acetaminophen (TYLENOL) 325 MG tablet, Take 650 mg by mouth every 6 (six) hours as needed for moderate pain. (Patient not taking: Reported on 09/20/2021), Disp: , Rfl:    cyclobenzaprine (FLEXERIL) 10 MG tablet, Take 1 tablet (10 mg total) by mouth 2 (two) times daily as needed for muscle spasms., Disp: 20 tablet, Rfl: 0   doxycycline (VIBRAMYCIN) 100 MG capsule, Take 1 capsule (100 mg total) by mouth 2 (two) times daily., Disp: 20 capsule, Rfl: 0   ibuprofen (ADVIL) 800 MG tablet, Take 1 tablet (800 mg total) by mouth every 8 (eight) hours as needed. (Patient not taking: Reported on 09/20/2021), Disp: 30 tablet, Rfl: 0   meloxicam (MOBIC) 15 MG tablet, Take 1 tablet (15 mg total) by mouth daily., Disp: 30 tablet, Rfl: 0   Multiple Vitamin (MULTIVITAMIN WITH MINERALS) TABS tablet, Take 1 tablet by mouth daily., Disp: , Rfl:    Medications ordered in this encounter:  No orders of the defined types were placed in this encounter.    *If you need refills on other medications prior to your next appointment, please contact your pharmacy*  Follow-Up: Call back or seek an in-person evaluation if the symptoms worsen or if the condition fails to improve as anticipated.  [facility] [phone]  Other Instructions Keep hydrated and rest. Ok to continue OTC Tylenol.  Start Meloxicam once daily with food.  If symptoms are not resolving or you note any new/worsening or reoccurring symptoms, you need to seek an in-person evaluation ASAP.   If you have been instructed to have an in-person evaluation today at a local Urgent Care facility, please use the link below. It will take you to a list of all of our available [facility], including address, phone number and hours of operation. Please do not delay care.  [facility]  If you or a family member do not have a primary care provider, use the link below to schedule a visit and establish care. When you choose a [facility] primary care physician or advanced practice provider, you gain a long-term partner in health. Find a Primary Care Provider  Learn more about [facility] in-office and virtual care options: [facility] - Get Care Now

## 2023-04-18 ENCOUNTER — Encounter: Payer: Self-pay | Admitting: *Deleted

## 2023-04-18 ENCOUNTER — Ambulatory Visit
Admission: EM | Admit: 2023-04-18 | Discharge: 2023-04-18 | Disposition: A | Discharge status: Home / Self Care | Payer: BC Managed Care – PPO | Attending: Emergency Medicine | Admitting: Emergency Medicine

## 2023-04-18 DIAGNOSIS — R0789 Other chest pain: Secondary | ICD-10-CM

## 2023-04-18 MED ORDER — PREDNISONE 10 MG (21) PO TBPK: ORAL_TABLET | Freq: Every day | ORAL | 0 refills | Status: DC

## 2023-04-18 NOTE — Discharge Instructions (Signed)
Your evaluated for persisting chest pain which based on your exam is muscular as I am able to reproduce it by pushing on the chest wall  Vital signs are within normal ranges, blood pressure slightly elevated but nonconcerning at this time as you have no prior cardiac history, blood pressure tends to become elevated while you are in pain, this is normal  EKG shows heart is beating in a normal pace and rhythm and normal heart and lung sounds are heard when you are listening to  Begin prednisone every morning with food as directed to reduce inflammation and help with pain, may continue use of Tylenol taking 500 to 1000 mg every 6 hours as needed in addition to this  You may use heat over the affected area in 10 to 15-minute intervals  You have been given compression to add stability and support when you are completing activities, may use as needed  Typically this fixes itself within a 1 to 2 weeks.  However if symptoms continue to persist worsen or recur you may follow-up as needed

## 2023-04-18 NOTE — ED Provider Notes (Signed)
Antonio Blackburn    CSN: [id number] Arrival date & time: 04/18/23  1042      History   Chief Complaint Chief Complaint  Patient presents with   Ribcage Pain    HPI Antonio Blackburn is a 37 y.o. male.   Patient presents for evaluation of right-sided chest pain beginning 3 days ago.  Pain has been constant and has begun to radiate into the right upper aspect of the back.  Described as a aching pain with intermittent sharp shooting pains.  Symptoms exacerbated by deep breathing, bending, palpation, coughing, laughing etc.  Completed a telehealth visit 2 days ago in which she was prescribed meloxicam, no improvement in symptoms.  Additionally has used Tylenol.  Has not occurred before.  Daily tobacco use.  Denies cardiac history.  Recent viral illness resolving 2 days before symptoms beginning.  Past Medical History:  Diagnosis Date   Asthma    as a child   Clavicle fracture    right   GERD (gastroesophageal reflux disease)    occ no meds   History of methicillin resistant staphylococcus aureus (MRSA) 2016    Patient Active Problem List   Diagnosis Date Noted   Status post laparoscopic hernia repair 01/10/2021   Bilateral recurrent inguinal hernia without obstruction or gangrene 12/18/2020    Past Surgical History:  Procedure Laterality Date   HERNIA REPAIR     NO PAST SURGERIES     XI ROBOTIC ASSISTED INGUINAL HERNIA REPAIR WITH MESH Bilateral 12/26/2020   Procedure: XI ROBOTIC ASSISTED INGUINAL HERNIA REPAIR WITH MESH, BILATERAL;  Surgeon: Campbell Lerner, MD;  Location: [facility] ORS;  Service: General;  Laterality: Bilateral;       Home Medications    Prior to Admission medications   Medication Sig Start Date End Date Taking? Authorizing Provider  meloxicam (MOBIC) 15 MG tablet Take 1 tablet (15 mg total) by mouth daily. 04/16/23  Yes Waldon Merl, PA-C  predniSONE (STERAPRED UNI-PAK 21 TAB) 10 MG (21) TBPK tablet Take by mouth daily. Take 6 tabs by mouth  daily  for 1 days, then 5 tabs for 1 days, then 4 tabs for 1 days, then 3 tabs for 1 days, 2 tabs for 1 days, then 1 tab by mouth daily for 1 days 04/18/23  Yes Valinda Hoar, NP    Family History History reviewed. No pertinent family history.  Social History Social History   Tobacco Use   Smoking status: Every Day    Current packs/day: 0.50    Average packs/day: 0.5 packs/day for 12.0 years (6.0 ttl pk-yrs)    Types: Cigarettes   Smokeless tobacco: Never  Vaping Use   Vaping status: Never Used  Substance Use Topics   Alcohol use: Not Currently    Comment: occasionally   Drug use: Yes    Types: Marijuana    Comment: daily     Allergies   Patient has no known allergies.   Review of Systems Review of Systems   Physical Exam Triage Vital Signs ED Triage Vitals [04/18/23 1108]  Encounter Vitals Group     BP (!) 151/94     Systolic BP Percentile      Diastolic BP Percentile      Pulse Rate 80     Resp 18     Temp 99.6 F (37.6 C)     Temp Source Oral     SpO2 98 %     Weight      Height  Head Circumference      Peak Flow      Pain Score 4     Pain Loc      Pain Education      Exclude from Growth Chart    No data found.  Updated Vital Signs BP (!) 151/94   Pulse 80   Temp 99.6 F (37.6 C) (Oral)   Resp 18   SpO2 98%   Visual Acuity Right Eye Distance:   Left Eye Distance:   Bilateral Distance:    Right Eye Near:   Left Eye Near:    Bilateral Near:     Physical Exam Constitutional:      Appearance: Normal appearance.  HENT:     Head: Normocephalic.  Eyes:     Extraocular Movements: Extraocular movements intact.  Cardiovascular:     Rate and Rhythm: Normal rate and regular rhythm.     Pulses: Normal pulses.     Heart sounds: Normal heart sounds.  Pulmonary:     Effort: Pulmonary effort is normal.     Breath sounds: Normal breath sounds.  Chest:     Comments: Tenderness present over the right side of the chest wall approximately  ribs 3 through 5, extending into the flank, chest wall is symmetrical, no ecchymosis or deformity Neurological:     Mental Status: He is alert and oriented to person, place, and time. Mental status is at baseline.      UC Treatments / Results  Labs (all labs ordered are listed, but only abnormal results are displayed) Labs Reviewed - No data to display  EKG   Radiology No results found.  Procedures Procedures (including critical care time)  Medications Ordered in UC Medications - No data to display  Initial Impression / Assessment and Plan / UC Course  I have reviewed the triage vital signs and the nursing notes.  Pertinent labs & imaging results that were available during my care of the patient were reviewed by me and considered in my medical decision making (see chart for details).  Right-sided chest wall pain  Vitals are stable, patient is in no signs of distress nontoxic-appearing, blood pressure slightly elevated at 151/94, currently experiencing pain, no prior cardiac history, low risk, EKG shows normal sinus rhythm, pain reproducible on exam, stable for outpatient management as etiology is muscular, prescribed prednisone and given compression wrap to be used as needed recommended heat with activity as tolerated, advise follow-up if symptoms persist worsen or recur  Final Clinical Impressions(s) / UC Diagnoses   Final diagnoses:  Right-sided chest wall pain     Discharge Instructions      Your evaluated for persisting chest pain which based on your exam is muscular as I am able to reproduce it by pushing on the chest wall  Vital signs are within normal ranges, blood pressure slightly elevated but nonconcerning at this time as you have no prior cardiac history, blood pressure tends to become elevated while you are in pain, this is normal  EKG shows heart is beating in a normal pace and rhythm and normal heart and lung sounds are heard when you are listening  to  Begin prednisone every morning with food as directed to reduce inflammation and help with pain, may continue use of Tylenol taking 500 to 1000 mg every 6 hours as needed in addition to this  You may use heat over the affected area in 10 to 15-minute intervals  You have been given compression to add stability  and support when you are completing activities, may use as needed  Typically this fixes itself within a 1 to 2 weeks.  However if symptoms continue to persist worsen or recur you may follow-up as needed   ED Prescriptions     Medication Sig Dispense Auth. Provider   predniSONE (STERAPRED UNI-PAK 21 TAB) 10 MG (21) TBPK tablet Take by mouth daily. Take 6 tabs by mouth daily  for 1 days, then 5 tabs for 1 days, then 4 tabs for 1 days, then 3 tabs for 1 days, 2 tabs for 1 days, then 1 tab by mouth daily for 1 days 21 tablet Callaway Hailes, Elita Boone, NP      PDMP not reviewed this encounter.   Valinda Hoar, NP 04/18/23 1156

## 2023-04-18 NOTE — ED Triage Notes (Signed)
States had HA, dizziness, chills, tactile fever approx 5 days ago which resolved after couple days. Denies any cough. 3 days ago started with pain to right side of sternum with deep breathing, bending forward, and palpation. States had televisit 2 days ago and was given Rx with meloxicam, and was told to get in -person eval if no improvement. Pt states 2 days ago had some pains wrapping around right side of ribcage around to back, and then also to the left side. States meloxicam not improving the pain.

## 2023-05-16 ENCOUNTER — Telehealth (HOSPITAL_COMMUNITY): Payer: Self-pay | Admitting: Emergency Medicine

## 2023-05-16 ENCOUNTER — Emergency Department
Admission: EM | Admit: 2023-05-16 | Discharge: 2023-05-16 | Disposition: A | Discharge status: Home / Self Care | Payer: BC Managed Care – PPO | Source: Home / Self Care

## 2023-05-16 ENCOUNTER — Ambulatory Visit (HOSPITAL_COMMUNITY)
Admission: EM | Admit: 2023-05-16 | Discharge: 2023-05-16 | Disposition: A | Discharge status: Home / Self Care | Payer: BC Managed Care – PPO | Attending: Nurse Practitioner | Admitting: Nurse Practitioner

## 2023-05-16 ENCOUNTER — Encounter (HOSPITAL_COMMUNITY): Payer: Self-pay | Admitting: Emergency Medicine

## 2023-05-16 DIAGNOSIS — M940 Chondrocostal junction syndrome [Tietze]: Secondary | ICD-10-CM | POA: Diagnosis not present

## 2023-05-16 DIAGNOSIS — J069 Acute upper respiratory infection, unspecified: Secondary | ICD-10-CM | POA: Diagnosis not present

## 2023-05-16 MED ORDER — TIZANIDINE HCL 4 MG PO TABS: 4.0000 mg | ORAL_TABLET | Freq: Three times a day (TID) | ORAL | 0 refills | Status: DC | PRN

## 2023-05-16 MED ORDER — KETOROLAC TROMETHAMINE 30 MG/ML IJ SOLN
INTRAMUSCULAR | Status: AC
  Filled 2023-05-16: qty 1

## 2023-05-16 MED ORDER — BENZONATATE 100 MG PO CAPS
100.0000 mg | ORAL_CAPSULE | Freq: Three times a day (TID) | ORAL | 0 refills | Status: DC | PRN
Start: 2023-05-16 — End: 2023-05-16

## 2023-05-16 MED ORDER — TIZANIDINE HCL 4 MG PO TABS
4.0000 mg | ORAL_TABLET | Freq: Three times a day (TID) | ORAL | 0 refills | Status: AC | PRN
Start: 2023-05-16 — End: ?

## 2023-05-16 MED ORDER — KETOROLAC TROMETHAMINE 30 MG/ML IJ SOLN
30.0000 mg | Freq: Once | INTRAMUSCULAR | Status: AC
  Administered 2023-05-16: 30 mg via INTRAMUSCULAR

## 2023-05-16 MED ORDER — BENZONATATE 100 MG PO CAPS
100.0000 mg | ORAL_CAPSULE | Freq: Three times a day (TID) | ORAL | 0 refills | Status: AC | PRN
Start: 2023-05-16 — End: ?

## 2023-05-16 NOTE — ED Provider Notes (Signed)
[facility]    CSN: [id number] Arrival date & time: 05/16/23  1646      History   Chief Complaint Chief Complaint  Patient presents with   Cough    HPI Antonio Blackburn is a 37 y.o. male.   Patient presents today with 3-week history of cough, now having pain in his chest and his back when he takes a deep breath.  Reports at first, he also had a fever, bodyaches and chills, runny and stuffy nose but that is now improved.  Patient reports he is not coughing up mucus, denies sore throat headache, ear pain, abdominal pain, nausea/vomiting, and diarrhea.  No change in appetite.  He has been a little bit more fatigued during the day.  Has been taking over-the-counter medications without relief.  Was initially seen in urgent care and treated for a pulled muscle with prednisone, reports symptoms have not improved at all since that time.  Reports he had to leave work early today and is having a hard time working because of the pain he has when he moves his trunk.   Patient denies history of hypertension.  Reports he regularly has his blood pressure checked when he donates plasma and is always normal.  Reports he is wondering if the blood pressure is elevated today due to the pain he is in.    Past Medical History:  Diagnosis Date   Asthma    as a child   Clavicle fracture    right   GERD (gastroesophageal reflux disease)    occ no meds   History of methicillin resistant staphylococcus aureus (MRSA) 2016    Patient Active Problem List   Diagnosis Date Noted   Status post laparoscopic hernia repair 01/10/2021   Bilateral recurrent inguinal hernia without obstruction or gangrene 12/18/2020    Past Surgical History:  Procedure Laterality Date   HERNIA REPAIR     NO PAST SURGERIES     XI ROBOTIC ASSISTED INGUINAL HERNIA REPAIR WITH MESH Bilateral 12/26/2020   Procedure: XI ROBOTIC ASSISTED INGUINAL HERNIA REPAIR WITH MESH, BILATERAL;  Surgeon: Campbell Lerner, MD;   Location: [facility] ORS;  Service: General;  Laterality: Bilateral;       Home Medications    Prior to Admission medications   Medication Sig Start Date End Date Taking? Authorizing Provider  benzonatate (TESSALON) 100 MG capsule Take 1 capsule (100 mg total) by mouth 3 (three) times daily as needed for cough. Do not take with alcohol or while driving or operating heavy machinery.  May cause drowsiness. 05/16/23   Valentino Nose, NP  tiZANidine (ZANAFLEX) 4 MG tablet Take 1 tablet (4 mg total) by mouth every 8 (eight) hours as needed for muscle spasms. Do not take with alcohol or while driving or operating heavy machinery.  May cause drowsiness. 05/16/23   Valentino Nose, NP    Family History History reviewed. No pertinent family history.  Social History Social History   Tobacco Use   Smoking status: Every Day    Current packs/day: 0.50    Average packs/day: 0.5 packs/day for 12.0 years (6.0 ttl pk-yrs)    Types: Cigarettes   Smokeless tobacco: Never  Vaping Use   Vaping status: Never Used  Substance Use Topics   Alcohol use: Not Currently    Comment: occasionally   Drug use: Yes    Types: Marijuana    Comment: daily     Allergies   Patient has no known allergies.   Review  of Systems Review of Systems Per HPI  Physical Exam Triage Vital Signs ED Triage Vitals  Encounter Vitals Group     BP 05/16/23 1702 (!) 161/115     Systolic BP Percentile --      Diastolic BP Percentile --      Pulse Rate 05/16/23 1702 85     Resp 05/16/23 1702 17     Temp 05/16/23 1702 99.8 F (37.7 C)     Temp Source 05/16/23 1702 Oral     SpO2 05/16/23 1702 97 %     Weight --      Height --      Head Circumference --      Peak Flow --      Pain Score 05/16/23 1701 8     Pain Loc --      Pain Education --      Exclude from Growth Chart --    No data found.  Updated Vital Signs BP (!) 161/115 (BP Location: Left Arm)   Pulse 85   Temp 99.8 F (37.7 C) (Oral)   Resp 17    SpO2 97%   Visual Acuity Right Eye Distance:   Left Eye Distance:   Bilateral Distance:    Right Eye Near:   Left Eye Near:    Bilateral Near:     Physical Exam Vitals and nursing note reviewed.  Constitutional:      General: He is not in acute distress.    Appearance: Normal appearance. He is not ill-appearing or toxic-appearing.  HENT:     Head: Normocephalic and atraumatic.     Right Ear: Tympanic membrane, ear canal and external ear normal.     Left Ear: Tympanic membrane, ear canal and external ear normal.     Nose: No congestion or rhinorrhea.     Mouth/Throat:     Mouth: Mucous membranes are moist.     Pharynx: Oropharynx is clear. No oropharyngeal exudate or posterior oropharyngeal erythema.     Comments: Postnasal drainage Eyes:     General: No scleral icterus.    Extraocular Movements: Extraocular movements intact.  Cardiovascular:     Rate and Rhythm: Normal rate and regular rhythm.  Pulmonary:     Effort: Pulmonary effort is normal. No respiratory distress.     Breath sounds: Normal breath sounds. No wheezing, rhonchi or rales.  Musculoskeletal:     Cervical back: Normal range of motion and neck supple.  Lymphadenopathy:     Cervical: No cervical adenopathy.  Skin:    General: Skin is warm and dry.     Coloration: Skin is not jaundiced or pale.     Findings: No erythema or rash.  Neurological:     Mental Status: He is alert and oriented to person, place, and time.  Psychiatric:        Behavior: Behavior is cooperative.      UC Treatments / Results  Labs (all labs ordered are listed, but only abnormal results are displayed) Labs Reviewed - No data to display  EKG   Radiology No results found.  Procedures Procedures (including critical care time)  Medications Ordered in UC Medications  ketorolac (TORADOL) 30 MG/ML injection 30 mg (30 mg Intramuscular Given 05/16/23 1741)    Initial Impression / Assessment and Plan / UC Course  I have  reviewed the triage vital signs and the nursing notes.  Pertinent labs & imaging results that were available during my care of the patient were reviewed by  me and considered in my medical decision making (see chart for details).   Patient is mildly hypertensive in triage, otherwise vital signs are stable.  1. Viral URI with cough 2. Costochondritis Suspect viral etiology with lingering cough Possible costochondritis due to coughing Treat with cough suppressant medication, muscle relaxant medication; Toradol 30 mg IM given in urgent care today for pain control Offered x-ray imaging however patient declines after my reassurance that lungs are clear and vitals are stable Return and ER precautions are discussed and work excuse provided  The patient was given the opportunity to ask questions.  All questions answered to their satisfaction.  The patient is in agreement to this plan.    Final Clinical Impressions(s) / UC Diagnoses   Final diagnoses:  Viral URI with cough  Costochondritis     Discharge Instructions      We have given you an injection of Toradol today which is a anti-inflammatory similar to ibuprofen.  Do not take any ibuprofen or other NSAIDs until Monday.  In the meantime, you can take Tylenol as needed for pain as well as the tizanidine (muscle relaxant) I have sent to the pharmacy.  In addition, start the cough suppressant medication to help with the dry cough.  Symptoms should improve over the next couple of weeks.  Seek care if they do not improve.    ED Prescriptions     Medication Sig Dispense Auth. Provider   benzonatate (TESSALON) 100 MG capsule Take 1 capsule (100 mg total) by mouth 3 (three) times daily as needed for cough. Do not take with alcohol or while driving or operating heavy machinery.  May cause drowsiness. 21 capsule Cathlean Marseilles A, NP   tiZANidine (ZANAFLEX) 4 MG tablet Take 1 tablet (4 mg total) by mouth every 8 (eight) hours as needed for muscle  spasms. Do not take with alcohol or while driving or operating heavy machinery.  May cause drowsiness. 30 tablet Valentino Nose, NP      PDMP not reviewed this encounter.   Valentino Nose, NP 05/16/23 [phone]

## 2023-05-16 NOTE — ED Triage Notes (Addendum)
Pt c/o cough, congestion, chest/back discomfort that started last Wednesday. He is concerned he has pneumonia.  He was seen at [facility] in [place] and dx with pulled muscle at that time

## 2023-05-16 NOTE — Discharge Instructions (Addendum)
We have given you an injection of Toradol today which is a anti-inflammatory similar to ibuprofen.  Do not take any ibuprofen or other NSAIDs until Monday.  In the meantime, you can take Tylenol as needed for pain as well as the tizanidine (muscle relaxant) I have sent to the pharmacy.  In addition, start the cough suppressant medication to help with the dry cough.  Symptoms should improve over the next couple of weeks.  Seek care if they do not improve.
